# Patient Record
Sex: Female | Born: 1945
Health system: Southern US, Community
[De-identification: ages and names within clinical notes are randomized; demographics above are authoritative.]

## PROBLEM LIST (undated history)

## (undated) DIAGNOSIS — E039 Hypothyroidism, unspecified: Secondary | ICD-10-CM

## (undated) DIAGNOSIS — D649 Anemia, unspecified: Secondary | ICD-10-CM

## (undated) DIAGNOSIS — R112 Nausea with vomiting, unspecified: Secondary | ICD-10-CM

## (undated) DIAGNOSIS — R590 Localized enlarged lymph nodes: Secondary | ICD-10-CM

## (undated) DIAGNOSIS — T4145XA Adverse effect of unspecified anesthetic, initial encounter: Secondary | ICD-10-CM

## (undated) DIAGNOSIS — G629 Polyneuropathy, unspecified: Secondary | ICD-10-CM

## (undated) DIAGNOSIS — G893 Neoplasm related pain (acute) (chronic): Secondary | ICD-10-CM

## (undated) DIAGNOSIS — T8859XA Other complications of anesthesia, initial encounter: Secondary | ICD-10-CM

## (undated) DIAGNOSIS — C50919 Malignant neoplasm of unspecified site of unspecified female breast: Secondary | ICD-10-CM

## (undated) DIAGNOSIS — E079 Disorder of thyroid, unspecified: Secondary | ICD-10-CM

## (undated) DIAGNOSIS — C801 Malignant (primary) neoplasm, unspecified: Secondary | ICD-10-CM

## (undated) DIAGNOSIS — I10 Essential (primary) hypertension: Secondary | ICD-10-CM

## (undated) DIAGNOSIS — K5790 Diverticulosis of intestine, part unspecified, without perforation or abscess without bleeding: Secondary | ICD-10-CM

## (undated) DIAGNOSIS — C439 Malignant melanoma of skin, unspecified: Secondary | ICD-10-CM

## (undated) DIAGNOSIS — E236 Other disorders of pituitary gland: Secondary | ICD-10-CM

## (undated) DIAGNOSIS — K219 Gastro-esophageal reflux disease without esophagitis: Secondary | ICD-10-CM

## (undated) DIAGNOSIS — G43909 Migraine, unspecified, not intractable, without status migrainosus: Secondary | ICD-10-CM

## (undated) DIAGNOSIS — Z9889 Other specified postprocedural states: Secondary | ICD-10-CM

## (undated) HISTORY — DX: Migraine, unspecified, not intractable, without status migrainosus: G43.909

## (undated) HISTORY — DX: Polyneuropathy, unspecified: G62.9

## (undated) HISTORY — DX: Diverticulosis of intestine, part unspecified, without perforation or abscess without bleeding: K57.90

## (undated) HISTORY — PX: HERNIA REPAIR: SHX51

## (undated) HISTORY — PX: OTHER SURGICAL HISTORY: SHX169

## (undated) HISTORY — DX: Malignant (primary) neoplasm, unspecified: C80.1

## (undated) HISTORY — DX: Disorder of thyroid, unspecified: E07.9

---
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
## 1990-06-09 DIAGNOSIS — C801 Malignant (primary) neoplasm, unspecified: Secondary | ICD-10-CM

## 1990-06-09 DIAGNOSIS — C50919 Malignant neoplasm of unspecified site of unspecified female breast: Secondary | ICD-10-CM

## 1990-06-09 HISTORY — PX: BREAST SURGERY: SHX581

## 1990-06-09 HISTORY — DX: Malignant neoplasm of unspecified site of unspecified female breast: C50.919

## 1990-06-09 HISTORY — DX: Malignant (primary) neoplasm, unspecified: C80.1

## 1991-06-10 HISTORY — PX: BREAST LUMPECTOMY: SHX2

## 2000-11-18 ENCOUNTER — Other Ambulatory Visit: Admission: RE | Admit: 2000-11-18 | Discharge: 2000-11-18 | Payer: Self-pay | Admitting: Family Medicine

## 2001-07-30 ENCOUNTER — Other Ambulatory Visit: Admission: RE | Admit: 2001-07-30 | Discharge: 2001-07-30 | Payer: Self-pay | Admitting: Family Medicine

## 2004-06-09 DIAGNOSIS — K5790 Diverticulosis of intestine, part unspecified, without perforation or abscess without bleeding: Secondary | ICD-10-CM

## 2004-06-09 HISTORY — DX: Diverticulosis of intestine, part unspecified, without perforation or abscess without bleeding: K57.90

## 2004-06-09 HISTORY — PX: COLONOSCOPY: SHX174

## 2004-08-27 ENCOUNTER — Ambulatory Visit: Payer: Self-pay | Admitting: Family Medicine

## 2004-09-13 ENCOUNTER — Ambulatory Visit: Payer: Self-pay | Admitting: General Surgery

## 2004-09-13 LAB — HM COLONOSCOPY

## 2005-11-10 ENCOUNTER — Ambulatory Visit: Payer: Self-pay | Admitting: Family Medicine

## 2006-12-21 ENCOUNTER — Ambulatory Visit: Payer: Self-pay | Admitting: Family Medicine

## 2006-12-24 ENCOUNTER — Ambulatory Visit: Payer: Self-pay | Admitting: Family Medicine

## 2007-06-23 ENCOUNTER — Ambulatory Visit: Payer: Self-pay | Admitting: General Surgery

## 2008-07-06 ENCOUNTER — Ambulatory Visit: Payer: Self-pay | Admitting: Family Medicine

## 2009-06-21 ENCOUNTER — Ambulatory Visit: Payer: Self-pay | Admitting: Family Medicine

## 2009-07-10 ENCOUNTER — Ambulatory Visit: Payer: Self-pay | Admitting: Family Medicine

## 2010-07-11 ENCOUNTER — Ambulatory Visit: Payer: Self-pay | Admitting: Family Medicine

## 2011-07-23 ENCOUNTER — Ambulatory Visit: Payer: Self-pay | Admitting: Family Medicine

## 2011-09-03 LAB — HM PAP SMEAR: HM Pap smear: NEGATIVE

## 2011-09-03 LAB — HM DEXA SCAN

## 2012-07-29 ENCOUNTER — Ambulatory Visit: Payer: Self-pay | Admitting: Family Medicine

## 2012-12-30 DIAGNOSIS — K611 Rectal abscess: Secondary | ICD-10-CM | POA: Insufficient documentation

## 2013-01-05 ENCOUNTER — Encounter: Payer: Self-pay | Admitting: *Deleted

## 2013-01-05 ENCOUNTER — Ambulatory Visit (INDEPENDENT_AMBULATORY_CARE_PROVIDER_SITE_OTHER): Payer: Medicare Other | Admitting: General Surgery

## 2013-01-05 ENCOUNTER — Encounter: Payer: Self-pay | Admitting: General Surgery

## 2013-01-05 VITALS — BP 130/72 | HR 78 | Temp 98.9°F | Resp 12 | Ht 62.0 in | Wt 159.0 lb

## 2013-01-05 DIAGNOSIS — K612 Anorectal abscess: Secondary | ICD-10-CM

## 2013-01-05 DIAGNOSIS — K611 Rectal abscess: Secondary | ICD-10-CM

## 2013-01-05 MED ORDER — DOXYCYCLINE HYCLATE 100 MG PO TABS: 100.0000 mg | ORAL_TABLET | Freq: Two times a day (BID) | ORAL | Status: DC

## 2013-01-05 NOTE — Progress Notes (Addendum)
Patient ID: Katie Keith, female   DO: 1945-12-10, 67 y.o.   MRM: [id number]  Chief Complaint  Patient presents with  . Rectal Problems    possible abscess    HPI Katie Keith is a 67 y.o. female here today for a rectal abscess. Patient noticed it on Thursday and went to Dr. Sullivan Lone on 01/04/13.  States "I just feel rotten". The patient reports no change in bowel habits prior to the noticing a small thickening anterior and to the right of the anus followed by the markedly increased local pain and swelling. No history of anal trauma, diarrhea or constipation.  HPI  Past Medical History  Diagnosis Date  . Cancer 1992    breast  . Thyroid disease   . Migraines   . Diverticulosis 2006    Past Surgical History  Procedure Laterality Date  . Hernia repair    . Colonoscopy  2006    Dr Lemar Livings  . Breast surgery Left 1992    wide excision for DCIS    Family History  Problem Relation Age of Onset  . Breast cancer Mother   . Lung cancer Father     Social History History  Substance Use Topics  . Smoking status: Never Smoker   . Smokeless tobacco: Never Used  . Alcohol Use: No    Allergies  Allergen Reactions  . Penicillins Swelling    Current Outpatient Prescriptions  Medication Sig Dispense Refill  . carvedilol (COREG) 6.25 MG tablet Take 6.25 mg by mouth 2 (two) times daily with a meal.       . doxycycline (VIBRA-TABS) 100 MG tablet Take 1 tablet (100 mg total) by mouth 2 (two) times daily.  20 tablet  0  . hydroxychloroquine (PLAQUENIL) 200 MG tablet Take 200 mg by mouth 2 (two) times daily.       Marland Kitchen levothyroxine (SYNTHROID, LEVOTHROID) 112 MCG tablet Take 112 mcg by mouth daily before breakfast.       . venlafaxine (EFFEXOR) 75 MG tablet Take 75 mg by mouth 2 (two) times daily.        No current facility-administered medications for this visit.    Review of Systems Review of Systems  Constitutional: Positive for chills.  Respiratory: Negative.   Cardiovascular:  Negative.     Blood pressure 130/72, pulse 78, temperature 98.9 F (37.2 C), temperature source Oral, resp. rate 12, height 5\' 2"  (1.575 m), weight 159 lb (72.122 kg).  Physical Exam Physical Exam  Constitutional: She is oriented to person, place, and time. She appears well-developed and well-nourished.  Cardiovascular: Normal rate and regular rhythm.   Pulmonary/Chest: Effort normal and breath sounds normal.  Genitourinary:     Neurological: She is alert and oriented to person, place, and time.  Skin: Skin is warm and dry.   Marked erythema on the right anterior lateral rectum extending to labia   Data Reviewed PCP notes dated January 04, 2013.    Assessment    Perirectal abscess.     Plan    Indications for incision and drainage were reviewed. The area was cleansed with Betadine, and 10 cc of 0.5% Xylocaine with 0.25% Marcaine with 1:200,000 units epinephrine was utilized for local anesthesia. The area was reprepped with Betadine and draped. A 1/2 cm radial incision was made approximately 5 cm in the anus. Less than 5 cc of pale brown/green purulent material was obtained. A use of a hemostat loculations anteriorly towards the posterior aspect of the labia were freed. Minimal  tracking towards the anus. A dry dressing was applied. Culture was obtained. The procedure was well tolerated.  The patient will be placed on doxycycline 100 mg p.o. B.i.d. As she is allergic to penicillin. Katie Keith exposure was discouraged.  A prescription for Norco 5/325, #30 with the inscription 1-2 p.o. Q.4 h. P.r.n. For pain was provided, no refills.       Katie Keith 01/05/2013, 9:17 PM

## 2013-01-05 NOTE — Patient Instructions (Addendum)
Keep area clean Change gauze pad as needed Warm compresses Antibiotic and Norco prescription

## 2013-01-07 LAB — AEROBIC CULTURE

## 2013-01-12 ENCOUNTER — Other Ambulatory Visit: Payer: Self-pay

## 2013-01-17 ENCOUNTER — Encounter: Payer: Self-pay | Admitting: General Surgery

## 2013-01-17 ENCOUNTER — Ambulatory Visit (INDEPENDENT_AMBULATORY_CARE_PROVIDER_SITE_OTHER): Payer: Medicare Other | Admitting: General Surgery

## 2013-01-17 VITALS — BP 122/74 | HR 68 | Resp 14 | Ht 62.0 in | Wt 157.0 lb

## 2013-01-17 DIAGNOSIS — K611 Rectal abscess: Secondary | ICD-10-CM

## 2013-01-17 DIAGNOSIS — K612 Anorectal abscess: Secondary | ICD-10-CM

## 2013-01-17 NOTE — Progress Notes (Signed)
Patient ID: Katie Keith, female   DOB: 23-Nov-1945, 67 y.o.   MRN: [id number]  Chief Complaint  Patient presents with  . Follow-up    perrectal abscess    HPI Katie Keith is a 67 y.o. female. here today following up from an perirectal abscess drained done on 01/05/13.. Patient states she stop taking her antibiotic . She stated breaking out in an rash. She states her abscess better. HPI  Past Medical History  Diagnosis Date  . Cancer 1992    breast  . Thyroid disease   . Migraines   . Diverticulosis 2006    Past Surgical History  Procedure Laterality Date  . Hernia repair    . Colonoscopy  2006    Dr Lemar Livings  . Breast surgery Left 1992    wide excision for DCIS    Family History  Problem Relation Age of Onset  . Breast cancer Mother   . Lung cancer Father     Social History History  Substance Use Topics  . Smoking status: Never Smoker   . Smokeless tobacco: Never Used  . Alcohol Use: No    Allergies  Allergen Reactions  . Penicillins Swelling  . Sulfa Antibiotics Rash    Current Outpatient Prescriptions  Medication Sig Dispense Refill  . carvedilol (COREG) 6.25 MG tablet Take 6.25 mg by mouth 2 (two) times daily with a meal.       . doxycycline (VIBRA-TABS) 100 MG tablet Take 1 tablet (100 mg total) by mouth 2 (two) times daily.  20 tablet  0  . hydroxychloroquine (PLAQUENIL) 200 MG tablet Take 200 mg by mouth 2 (two) times daily.       Marland Kitchen levothyroxine (SYNTHROID, LEVOTHROID) 112 MCG tablet Take 112 mcg by mouth daily before breakfast.       . venlafaxine (EFFEXOR) 75 MG tablet Take 75 mg by mouth 2 (two) times daily.        No current facility-administered medications for this visit.    Review of Systems Review of Systems  Constitutional: Negative.   Respiratory: Negative.   Cardiovascular: Negative.     Blood pressure 122/74, pulse 68, resp. rate 14, height 5\' 2"  (1.575 m), weight 157 lb (71.215 kg).  Physical Exam Physical Exam  Genitourinary:      The right gluteal area shows no residual erythema, induration or tenderness.    Data Reviewed Culture showed evidence of non-MRSA.   Assessment    Likely perirectal abscess, resolved status post incision and drainage.     Plan    The patient will notify the office if she has any recurrent problems. She was advised that approximately 50% of patients will have no further difficulty, the other will develop a fistula in ano.        Earline Mayotte 01/17/2013, 9:20 PM

## 2013-01-17 NOTE — Patient Instructions (Signed)
Patient to return as needed. 

## 2013-04-14 ENCOUNTER — Other Ambulatory Visit: Payer: Self-pay

## 2013-08-01 ENCOUNTER — Ambulatory Visit: Payer: Self-pay | Admitting: Family Medicine

## 2013-08-01 LAB — HM MAMMOGRAPHY

## 2013-10-28 ENCOUNTER — Ambulatory Visit: Payer: Self-pay | Admitting: Otolaryngology

## 2014-03-24 ENCOUNTER — Other Ambulatory Visit: Payer: Self-pay

## 2014-04-10 ENCOUNTER — Encounter: Payer: Self-pay | Admitting: General Surgery

## 2014-08-02 ENCOUNTER — Ambulatory Visit: Payer: Self-pay | Admitting: Family Medicine

## 2014-08-24 ENCOUNTER — Encounter: Payer: Self-pay | Admitting: General Surgery

## 2014-10-03 ENCOUNTER — Ambulatory Visit
Admit: 2014-10-03 | Disposition: A | Discharge status: Home / Self Care | Payer: Self-pay | Attending: Family Medicine | Admitting: Family Medicine

## 2014-10-03 LAB — BASIC METABOLIC PANEL
BUN: 21 mg/dL (ref 4–21)
Creatinine: 1 mg/dL (ref <=1.1)
Glucose: 89 mg/dL
POTASSIUM: 5.4 mmol/L — AB (ref 3.4–5.3)
Sodium: 139 mmol/L (ref 137–147)

## 2014-10-03 LAB — LIPID PANEL
Cholesterol: 166 mg/dL (ref 0–200)
HDL: 27 mg/dL — AB (ref 35–70)
LDL Cholesterol: 88 mg/dL
Triglycerides: 254 mg/dL — AB (ref 40–160)

## 2014-10-03 LAB — HEPATIC FUNCTION PANEL
ALT: 13 U/L (ref 7–35)
AST: 20 U/L (ref 13–35)

## 2014-10-03 LAB — TSH: TSH: 1.25 u[IU]/mL (ref <=5.90)

## 2014-10-03 LAB — CBC AND DIFFERENTIAL
HCT: 41 % (ref 36–46)
Hemoglobin: 13.2 g/dL (ref 12.0–16.0)
PLATELETS: 298 10*3/uL (ref 150–399)
WBC: 7 10^3/mL

## 2014-10-04 ENCOUNTER — Ambulatory Visit: Payer: PPO | Admitting: General Surgery

## 2014-10-04 ENCOUNTER — Encounter: Payer: Self-pay | Admitting: General Surgery

## 2014-10-04 ENCOUNTER — Ambulatory Visit (INDEPENDENT_AMBULATORY_CARE_PROVIDER_SITE_OTHER): Payer: PPO | Admitting: General Surgery

## 2014-10-04 VITALS — BP 132/82 | HR 76 | Resp 14 | Ht 63.0 in | Wt 161.0 lb

## 2014-10-04 DIAGNOSIS — R1084 Generalized abdominal pain: Secondary | ICD-10-CM | POA: Insufficient documentation

## 2014-10-04 DIAGNOSIS — M546 Pain in thoracic spine: Secondary | ICD-10-CM | POA: Diagnosis not present

## 2014-10-04 NOTE — Progress Notes (Signed)
Patient ID: Katie Keith, female   DOB: 02/12/1946, 69 y.o.   MRN: [id number]  Chief Complaint  Patient presents with  . Colonoscopy    HPI Katie Keith is a 69 y.o. female.  Here today originally to discuss having a colonoscopy. She has had some left flank pain for about 2-3 weeks. She has been followed by Dr Rosanna Randy and had labs and chest xray yesterday. She is scheduled for ultrasound on abdominal Friday.Denies any diarrhea or constipation. She does have intermittent bloating. Bowels are normal and every day or every other day. She does have a history of ruptured disc and went to a chiropractor in the 1990's.  The patient reports she is most symptomatic when she is laying supine and trying to get out of bed. She is unable to sit up directly, rather needing to roll to her side, slider feet to the floor and then push her body upwards. She reports being comfortable while standing, walking or doing activities of daily living. She has not experienced any incontinence of bowel or bladder. She has a history of neuropathy of unknown etiology, but is not appreciated at progression of mild left-sided weakness.  HPI  Past Medical History  Diagnosis Date  . Cancer 1992    breast  . Thyroid disease   . Migraines   . Diverticulosis 2006  . Peripheral neuropathy     Past Surgical History  Procedure Laterality Date  . Hernia repair    . Colonoscopy  2006    Dr Bary Castilla  . Breast surgery Left 1992    wide excision for DCIS  . Thumb surgery      injury with infection    Family History  Problem Relation Age of Onset  . Breast cancer Mother   . Lung cancer Father   . Diabetes Mother   . Cancer Sister 92    breast    Social History History  Substance Use Topics  . Smoking status: Never Smoker   . Smokeless tobacco: Never Used  . Alcohol Use: No    Allergies  Allergen Reactions  . Penicillins Swelling  . Sulfa Antibiotics Rash    Current Outpatient Prescriptions   Medication Sig Dispense Refill  . ALPHA LIPOIC ACID PO Take by mouth 2 (two) times daily.    Marland Kitchen aspirin 81 MG tablet Take 81 mg by mouth daily.    . Biotin 5000 MCG CAPS Take by mouth daily.    . Calcium Carbonate-Vitamin D (CALCIUM + D PO) Take by mouth daily.    . carvedilol (COREG) 6.25 MG tablet Take 6.25 mg by mouth 2 (two) times daily with a meal.     . CINNAMON PO Take by mouth daily.    Marland Kitchen levothyroxine (SYNTHROID, LEVOTHROID) 112 MCG tablet Take 112 mcg by mouth daily before breakfast.      No current facility-administered medications for this visit.    Review of Systems Review of Systems  Constitutional: Negative.   Respiratory: Negative.   Cardiovascular: Negative.   Gastrointestinal: Positive for abdominal pain. Negative for diarrhea and constipation.    Blood pressure 132/82, pulse 76, resp. rate 14, height 5\' 3"  (1.6 m), weight 161 lb (73.029 kg).  Physical Exam Physical Exam  Constitutional: She is oriented to person, place, and time. She appears well-developed and well-nourished.  Cardiovascular: Normal rate, regular rhythm, normal heart sounds and intact distal pulses.   No lower leg edema.  Pulmonary/Chest: Effort normal and breath sounds normal.  Abdominal: Soft. Normal appearance.  Tender lower abdomen.  Musculoskeletal:  Tender at left 11th and 12th rib area.  Neurological: She is alert and oriented to person, place, and time. She has normal strength.  Skin: Skin is warm and dry.    Data Reviewed Chest x-ray completed yesterday reviewed. No cardiopulmonary abnormality. Age indeterminant superior endplate deformity within the lower thoracic spine.  Anoscopy completed 09/13/2004 showed a lymphoid nodule at 30 cm.  Assessment    Back/flank pain related to musculoskeletal source.    Plan    The patient is scheduled for an abdominal and pelvic ultrasound later this week by her PCP. If this is normal, MRI of the thoracic spine will be appropriate.     The patient exhibited exquisite pain while getting up from the examining table today. In light of this she's been asked to make use of a prednisone taper beginning at 60 mg today and decreasing by 10 daily. She was asked not to make use of other nonsteroidals while taking prednisone. Prednisone 60 mg taper.   PCP:  Cranford Mon, Donnal Moat, Forest Gleason 10/04/2014, 8:29 PM

## 2014-10-04 NOTE — Patient Instructions (Addendum)
Prednisone 60 mg today then taper by 10 mg each day.-no ibuprofen while on prednisone

## 2014-10-05 ENCOUNTER — Other Ambulatory Visit: Payer: Self-pay | Admitting: *Deleted

## 2014-10-05 DIAGNOSIS — M5414 Radiculopathy, thoracic region: Secondary | ICD-10-CM

## 2014-10-05 DIAGNOSIS — M5416 Radiculopathy, lumbar region: Secondary | ICD-10-CM

## 2014-10-05 NOTE — Progress Notes (Signed)
Patient has been scheduled for a MRI thoracic and lumbar for left radicular pain T12, L1 on Saturday, 10-14-14 at 10:45 am (arrive 10:15 [facility] She was offered 10-12-14 but will be out of town.  No prep.   This patient verbalizes understanding and will call the office if further questions.

## 2014-10-06 ENCOUNTER — Ambulatory Visit: Admit: 2014-10-06 | Disposition: A | Discharge status: Home / Self Care | Payer: Self-pay | Admitting: Family Medicine

## 2014-10-14 ENCOUNTER — Ambulatory Visit
Admission: RE | Admit: 2014-10-14 | Discharge: 2014-10-14 | Disposition: A | Discharge status: Home / Self Care | Payer: PPO | Source: Ambulatory Visit | Attending: General Surgery | Admitting: General Surgery

## 2014-10-14 DIAGNOSIS — M4854XA Collapsed vertebra, not elsewhere classified, thoracic region, initial encounter for fracture: Secondary | ICD-10-CM | POA: Insufficient documentation

## 2014-10-14 DIAGNOSIS — M5414 Radiculopathy, thoracic region: Secondary | ICD-10-CM

## 2014-10-14 DIAGNOSIS — X58XXXA Exposure to other specified factors, initial encounter: Secondary | ICD-10-CM | POA: Diagnosis not present

## 2014-10-14 DIAGNOSIS — M5416 Radiculopathy, lumbar region: Secondary | ICD-10-CM | POA: Diagnosis present

## 2014-10-17 ENCOUNTER — Telehealth: Payer: Self-pay | Admitting: *Deleted

## 2014-10-17 NOTE — Telephone Encounter (Signed)
appt with Dr Mack Guise is for May 19 at 930, pt aware.

## 2014-11-22 ENCOUNTER — Ambulatory Visit (INDEPENDENT_AMBULATORY_CARE_PROVIDER_SITE_OTHER): Payer: PPO

## 2014-11-22 DIAGNOSIS — E538 Deficiency of other specified B group vitamins: Secondary | ICD-10-CM | POA: Diagnosis not present

## 2014-11-22 MED ORDER — CYANOCOBALAMIN 1000 MCG/ML IJ SOLN
1000.0000 ug | Freq: Once | INTRAMUSCULAR | Status: AC
  Administered 2014-11-22: 1000 ug via INTRAMUSCULAR

## 2014-11-22 NOTE — Progress Notes (Signed)
Vitamin B12 injection today at office. Tolerated well. Will come back in 3 weeks for Vitamin B12 injection per MD's order.

## 2014-11-27 ENCOUNTER — Other Ambulatory Visit: Payer: Self-pay

## 2014-11-27 ENCOUNTER — Other Ambulatory Visit: Payer: Self-pay | Admitting: Family Medicine

## 2014-11-27 DIAGNOSIS — E039 Hypothyroidism, unspecified: Secondary | ICD-10-CM

## 2014-11-27 MED ORDER — LEVOTHYROXINE SODIUM 112 MCG PO TABS: 112.0000 ug | ORAL_TABLET | Freq: Every day | ORAL | Status: DC

## 2014-12-04 ENCOUNTER — Other Ambulatory Visit: Payer: Self-pay

## 2014-12-13 ENCOUNTER — Ambulatory Visit (INDEPENDENT_AMBULATORY_CARE_PROVIDER_SITE_OTHER): Payer: PPO

## 2014-12-13 DIAGNOSIS — E538 Deficiency of other specified B group vitamins: Secondary | ICD-10-CM | POA: Diagnosis not present

## 2014-12-13 MED ORDER — CYANOCOBALAMIN 1000 MCG/ML IJ SOLN
1000.0000 ug | Freq: Once | INTRAMUSCULAR | Status: AC
  Administered 2014-12-13: 1000 ug via INTRAMUSCULAR

## 2014-12-13 NOTE — Progress Notes (Signed)
Vitamin B12 Injection given today at office. Tolerated well. Will come back in 3 weeks per MD's order.

## 2015-01-03 ENCOUNTER — Ambulatory Visit (INDEPENDENT_AMBULATORY_CARE_PROVIDER_SITE_OTHER): Payer: PPO

## 2015-01-03 DIAGNOSIS — E538 Deficiency of other specified B group vitamins: Secondary | ICD-10-CM

## 2015-01-03 MED ORDER — CYANOCOBALAMIN 1000 MCG/ML IJ SOLN
1000.0000 ug | Freq: Once | INTRAMUSCULAR | Status: AC
  Administered 2015-01-03: 1000 ug via INTRAMUSCULAR

## 2015-01-03 NOTE — Progress Notes (Signed)
Vitamin B12 injection today at office. Tolerated well. Will come back in 3 weeks per MD's order

## 2015-01-24 ENCOUNTER — Ambulatory Visit (INDEPENDENT_AMBULATORY_CARE_PROVIDER_SITE_OTHER): Payer: PPO

## 2015-01-24 DIAGNOSIS — E538 Deficiency of other specified B group vitamins: Secondary | ICD-10-CM

## 2015-01-24 MED ORDER — CYANOCOBALAMIN 1000 MCG/ML IJ SOLN
1000.0000 ug | Freq: Once | INTRAMUSCULAR | Status: AC
  Administered 2015-01-24: 1000 ug via INTRAMUSCULAR

## 2015-01-24 NOTE — Progress Notes (Signed)
Vitamin B 12 injection today at office. Tolerated well Will come back in 3 weeks per MD's orders.

## 2015-02-14 ENCOUNTER — Ambulatory Visit: Payer: PPO

## 2015-02-14 NOTE — Patient Instructions (Signed)
Pt received a B12 injection today.

## 2015-02-16 ENCOUNTER — Ambulatory Visit (INDEPENDENT_AMBULATORY_CARE_PROVIDER_SITE_OTHER): Payer: PPO

## 2015-02-16 DIAGNOSIS — E538 Deficiency of other specified B group vitamins: Secondary | ICD-10-CM | POA: Diagnosis not present

## 2015-02-16 MED ORDER — CYANOCOBALAMIN 1000 MCG/ML IJ SOLN
1000.0000 ug | Freq: Once | INTRAMUSCULAR | Status: AC
  Administered 2015-02-16: 1000 ug via INTRAMUSCULAR

## 2015-02-21 ENCOUNTER — Ambulatory Visit (INDEPENDENT_AMBULATORY_CARE_PROVIDER_SITE_OTHER): Payer: PPO | Admitting: General Surgery

## 2015-02-21 ENCOUNTER — Encounter: Payer: Self-pay | Admitting: General Surgery

## 2015-02-21 VITALS — BP 124/72 | HR 82 | Resp 16 | Ht 62.0 in | Wt 152.2 lb

## 2015-02-21 DIAGNOSIS — Z853 Personal history of malignant neoplasm of breast: Secondary | ICD-10-CM

## 2015-02-21 DIAGNOSIS — Z1211 Encounter for screening for malignant neoplasm of colon: Secondary | ICD-10-CM | POA: Diagnosis not present

## 2015-02-21 MED ORDER — POLYETHYLENE GLYCOL 3350 17 GM/SCOOP PO POWD: ORAL | Status: DC

## 2015-02-21 NOTE — Patient Instructions (Addendum)
Colonoscopy A colonoscopy is an exam to look at the entire large intestine (colon). This exam can help find problems such as tumors, polyps, inflammation, and areas of bleeding. The exam takes about 1 hour.  LET [facility] CARE PROVIDER KNOW ABOUT:   Any allergies you have.  All medicines you are taking, including vitamins, herbs, eye drops, creams, and over-the-counter medicines.  Previous problems you or members of your family have had with the use of anesthetics.  Any blood disorders you have.  Previous surgeries you have had.  Medical conditions you have. RISKS AND COMPLICATIONS  Generally, this is a safe procedure. However, as with any procedure, complications can occur. Possible complications include:  Bleeding.  Tearing or rupture of the colon wall.  Reaction to medicines given during the exam.  Infection (rare). BEFORE THE PROCEDURE   Ask your health care provider about changing or stopping your regular medicines.  You may be prescribed an oral bowel prep. This involves drinking a large amount of medicated liquid, starting the day before your procedure. The liquid will cause you to have multiple loose stools until your stool is almost clear or light green. This cleans out your colon in preparation for the procedure.  Do not eat or drink anything else once you have started the bowel prep, unless your health care provider tells you it is safe to do so.  Arrange for someone to drive you home after the procedure. PROCEDURE   You will be given medicine to help you relax (sedative).  You will lie on your side with your knees bent.  A long, flexible tube with a light and camera on the end (colonoscope) will be inserted through the rectum and into the colon. The camera sends video back to a computer screen as it moves through the colon. The colonoscope also releases carbon dioxide gas to inflate the colon. This helps your health care provider see the area better.  During  the exam, your health care provider may take a small tissue sample (biopsy) to be examined under a microscope if any abnormalities are found.  The exam is finished when the entire colon has been viewed. AFTER THE PROCEDURE   Do not drive for 24 hours after the exam.  You may have a small amount of blood in your stool.  You may pass moderate amounts of gas and have mild abdominal cramping or bloating. This is caused by the gas used to inflate your colon during the exam.  Ask when your test results will be ready and how you will get your results. Make sure you get your test results. Document Released: 05/23/2000 Document Revised: 03/16/2013 Document Reviewed: 01/31/2013 [facility] Patient Information 2015 [place], [place]. This information is not intended to replace advice given to you by your health care provider. Make sure you discuss any questions you have with your health care provider.   Patient has been scheduled for a colonoscopy on 04-18-15 at [facility].

## 2015-02-21 NOTE — Progress Notes (Signed)
Patient ID: Katie Keith, female   DOB: 11-01-1945, 69 y.o.   MRN: [id number]  Chief Complaint  Patient presents with  . Colonoscopy    HPI Katie Keith is a 69 y.o. female here today to discuss having a Coloscopy. Last done 09/13/2004. She has no complaints today, regular bowel movements, no bleeding. She does state that she gets "bloated" feeling with large meal and feels "full" until she has a BM. Denies heartburn, indigestion or reflux. No change in bowel habits. No history of blood or mucus in the stools.  2006 colonoscopy was normal.  She does have a small spot on her lower back that she is concerned about.  HPI  Past Medical History  Diagnosis Date  . Thyroid disease   . Migraines   . Diverticulosis 2006  . Peripheral neuropathy   . Cancer 1992    DCIS left breast, treated with wide excision.    Past Surgical History  Procedure Laterality Date  . Hernia repair    . Colonoscopy  2006    Dr Bary Castilla  . Thumb surgery      injury with infection  . Breast surgery Left 1992    wide excision for DCIS    Family History  Problem Relation Age of Onset  . Breast cancer Mother   . Diabetes Mother   . Lung cancer Father   . Cancer Sister 68    breast    Social History Social History  Substance Use Topics  . Smoking status: Never Smoker   . Smokeless tobacco: Never Used  . Alcohol Use: No    Allergies  Allergen Reactions  . Penicillins Swelling  . Sulfa Antibiotics Rash    Current Outpatient Prescriptions  Medication Sig Dispense Refill  . aspirin 81 MG tablet Take 81 mg by mouth daily.    . Biotin 5000 MCG CAPS Take by mouth daily.    . Calcium Carbonate-Vitamin D (CALCIUM + D PO) Take by mouth daily.    . carvedilol (COREG) 6.25 MG tablet Take 6.25 mg by mouth 2 (two) times daily with a meal.     . CINNAMON PO Take by mouth daily.    Marland Kitchen levothyroxine (SYNTHROID, LEVOTHROID) 112 MCG tablet Take 1 tablet (112 mcg total) by mouth daily before breakfast. 30  tablet 12  . ALPHA LIPOIC ACID PO Take by mouth 2 (two) times daily.    . polyethylene glycol powder (GLYCOLAX/MIRALAX) powder 255 grams one bottle for colonoscopy prep 255 g 0   No current facility-administered medications for this visit.    Review of Systems Review of Systems  Constitutional: Negative.   Respiratory: Negative.   Cardiovascular: Negative.     Blood pressure 124/72, pulse 82, resp. rate 16, height 5\' 2"  (1.575 m), weight 152 lb 3.2 oz (69.037 kg). Weight in spring 2004:139 pounds.  Physical Exam Physical Exam  Constitutional: She is oriented to person, place, and time. She appears well-developed and well-nourished.  HENT:  Mouth/Throat: Oropharynx is clear and moist.  Eyes: Conjunctivae are normal. No scleral icterus.  Neck: Neck supple.  Cardiovascular: Normal rate, regular rhythm and normal heart sounds.   Pulmonary/Chest: Effort normal and breath sounds normal.  Abdominal: Soft. Normal appearance. There is tenderness in the left upper quadrant.  Neurological: She is alert and oriented to person, place, and time.  Skin: Skin is warm and dry.  Seborrheic keratosis  6 x 89 mm left of mid line lower back  Psychiatric: Her behavior is normal.  Data Reviewed 2006 colonoscopy showed a single polyp in the sigmoid colon. Pathology showed normal colonic mucosa and a lymphoid follicle.   Assessment    Candidate for screening colonoscopy.    Plan        Colonoscopy with possible biopsy/polypectomy prn: Information regarding the procedure, including its potential risks and complications (including but not limited to perforation of the bowel, which may require emergency surgery to repair, and bleeding) was verbally given to the patient. Educational information regarding lower intestinal endoscopy was given to the patient. Written instructions for how to complete the bowel prep using Miralax were provided. The importance of drinking ample fluids to avoid  dehydration as a result of the prep emphasized.  Patient has been scheduled for a colonoscopy on 04-18-15 at [facility]   PCP: Dr. Juliette Mangle, [place] Gleason 02/22/2015, 7:29 AM

## 2015-02-22 ENCOUNTER — Encounter: Payer: Self-pay | Admitting: General Surgery

## 2015-02-22 DIAGNOSIS — Z1211 Encounter for screening for malignant neoplasm of colon: Secondary | ICD-10-CM | POA: Insufficient documentation

## 2015-02-22 DIAGNOSIS — Z853 Personal history of malignant neoplasm of breast: Secondary | ICD-10-CM | POA: Insufficient documentation

## 2015-03-07 ENCOUNTER — Ambulatory Visit (INDEPENDENT_AMBULATORY_CARE_PROVIDER_SITE_OTHER): Payer: PPO

## 2015-03-07 DIAGNOSIS — E538 Deficiency of other specified B group vitamins: Secondary | ICD-10-CM | POA: Diagnosis not present

## 2015-03-07 MED ORDER — CYANOCOBALAMIN 1000 MCG/ML IJ SOLN
1000.0000 ug | Freq: Once | INTRAMUSCULAR | Status: AC
  Administered 2015-03-07: 1000 ug via INTRAMUSCULAR

## 2015-03-16 ENCOUNTER — Encounter: Payer: Self-pay | Admitting: Family Medicine

## 2015-03-16 ENCOUNTER — Ambulatory Visit (INDEPENDENT_AMBULATORY_CARE_PROVIDER_SITE_OTHER): Payer: PPO | Admitting: Family Medicine

## 2015-03-16 VITALS — BP 132/82 | HR 94 | Temp 97.7°F | Resp 18 | Wt 154.6 lb

## 2015-03-16 DIAGNOSIS — H6981 Other specified disorders of Eustachian tube, right ear: Secondary | ICD-10-CM | POA: Diagnosis not present

## 2015-03-16 DIAGNOSIS — H6980 Other specified disorders of Eustachian tube, unspecified ear: Secondary | ICD-10-CM | POA: Insufficient documentation

## 2015-03-16 NOTE — Progress Notes (Signed)
Patient ID: SHARVI MOONEYHAN, female   DOB: [birth year], 69 y.o.   MRN: [id number] Name: Katie Keith   MRN: [id number]    DOB: [birth year]   Date:03/16/2015       Progress Note  Subjective  Chief Complaint  Chief Complaint  Patient presents with  . Ear Pain    Right X 3 days   Otalgia  There is pain in the right ear. This is a new problem. The current episode started in the past 7 days. The problem occurs constantly. The problem has been gradually worsening. There has been no fever. Pertinent negatives include no ear discharge, headaches, hearing loss, rhinorrhea or sore throat. Associated symptoms comments: Stopped up sensation.. She has tried acetaminophen for the symptoms. The treatment provided no relief.   Past Surgical History  Procedure Laterality Date  . Hernia repair    . Colonoscopy  2006    Dr Bary Castilla  . Thumb surgery      injury with infection  . Breast surgery Left 1992    wide excision for DCIS   Family History  Problem Relation Age of Onset  . Breast cancer Mother   . Diabetes Mother   . Lung cancer Father   . Cancer Sister 44    breast   Past Medical History  Diagnosis Date  . Thyroid disease   . Migraines   . Diverticulosis 2006  . Peripheral neuropathy ([place])   . Cancer ([place]) 1992    DCIS left breast, treated with wide excision.   Social History  Substance Use Topics  . Smoking status: Never Smoker   . Smokeless tobacco: Never Used  . Alcohol Use: No    Current outpatient prescriptions:  .  aspirin 81 MG tablet, Take 81 mg by mouth daily., Disp: , Rfl:  .  Biotin 5000 MCG CAPS, Take by mouth daily., Disp: , Rfl:  .  Calcium Carbonate-Vitamin D (CALCIUM + D PO), Take by mouth daily., Disp: , Rfl:  .  carvedilol (COREG) 6.25 MG tablet, Take 6.25 mg by mouth 2 (two) times daily with a meal. , Disp: , Rfl:  .  CINNAMON PO, Take by mouth daily., Disp: , Rfl:  .  levothyroxine (SYNTHROID, LEVOTHROID) 112 MCG tablet, Take 1 tablet (112 mcg total) by  mouth daily before breakfast., Disp: 30 tablet, Rfl: 12 .  polyethylene glycol powder (GLYCOLAX/MIRALAX) powder, 255 grams one bottle for colonoscopy prep, Disp: 255 g, Rfl: 0  Allergies  Allergen Reactions  . Penicillins Swelling  . Sulfa Antibiotics Rash   Review of Systems  Constitutional: Negative.   HENT: Positive for ear pain. Negative for ear discharge, hearing loss, rhinorrhea and sore throat.   Eyes: Negative.   Respiratory: Negative.   Cardiovascular: Negative.   Gastrointestinal: Negative.   Genitourinary: Negative.   Musculoskeletal: Negative.   Skin: Negative.   Neurological: Negative.  Negative for headaches.  Endo/Heme/Allergies: Negative.   Psychiatric/Behavioral: Negative.    Objective  Filed Vitals:   03/16/15 0929  BP: 132/82  Pulse: 94  Temp: 97.7 F (36.5 C)  TempSrc: Oral  Resp: 18  Weight: 154 lb 9.6 oz (70.126 kg)  SpO2: 98%   Physical Exam  Constitutional: She is oriented to person, place, and time and well-developed, well-nourished, and in no distress.  HENT:  Head: Normocephalic.  Right Ear: External ear normal.  Left Ear: External ear normal.  Mouth/Throat: Oropharynx is clear and moist.  Eyes: Conjunctivae are normal.  Neck: Normal range of motion.  Neck supple.  Cardiovascular: Normal rate and regular rhythm.   Musculoskeletal: Normal range of motion.  Neurological: She is alert and oriented to person, place, and time.  Skin: No rash noted.   Assessment & Plan  1. Eustachian tube dysfunction, right Recent onset with stopped up sensation without drainage from the ear or URI symptoms today (had some last week). May use Flonase at bedtime and add Claritin prn. Recheck prn.

## 2015-03-28 ENCOUNTER — Ambulatory Visit (INDEPENDENT_AMBULATORY_CARE_PROVIDER_SITE_OTHER): Payer: PPO

## 2015-03-28 DIAGNOSIS — E538 Deficiency of other specified B group vitamins: Secondary | ICD-10-CM

## 2015-03-28 MED ORDER — CYANOCOBALAMIN 1000 MCG/ML IJ SOLN
1000.0000 ug | Freq: Once | INTRAMUSCULAR | Status: AC
  Administered 2015-03-28: 1000 ug via INTRAMUSCULAR

## 2015-03-28 NOTE — Progress Notes (Signed)
Patient ID: Katie Keith, female   DOB: 14-Feb-1946, 69 y.o.   MRN: [id number] Vitamin B 12 injection given today in office. Patient tolerated injection well. Return in 3 weeks.

## 2015-04-11 ENCOUNTER — Telehealth: Payer: Self-pay | Admitting: *Deleted

## 2015-04-11 NOTE — Telephone Encounter (Signed)
Patient contacted today and confirms no medication changes since last office visit.   Also, reports that she has Miralax prescription.   We will proceed with colonoscopy that is scheduled at [facility] for 04-18-15.   Patient instructed to call the office should she have further questions.

## 2015-04-13 ENCOUNTER — Ambulatory Visit (INDEPENDENT_AMBULATORY_CARE_PROVIDER_SITE_OTHER): Payer: PPO

## 2015-04-13 DIAGNOSIS — E538 Deficiency of other specified B group vitamins: Secondary | ICD-10-CM

## 2015-04-13 MED ORDER — CYANOCOBALAMIN 1000 MCG/ML IJ SOLN
1000.0000 ug | Freq: Once | INTRAMUSCULAR | Status: AC
  Administered 2015-04-13: 1000 ug via INTRAMUSCULAR

## 2015-04-13 NOTE — Progress Notes (Signed)
Vit. B12 injection was given today. Patient is to return in 3 weeks for next injection.

## 2015-04-16 ENCOUNTER — Other Ambulatory Visit: Payer: Self-pay | Admitting: General Surgery

## 2015-04-16 DIAGNOSIS — Z1211 Encounter for screening for malignant neoplasm of colon: Secondary | ICD-10-CM

## 2015-04-17 ENCOUNTER — Encounter: Payer: Self-pay | Admitting: *Deleted

## 2015-04-18 ENCOUNTER — Encounter: Payer: Self-pay | Admitting: *Deleted

## 2015-04-18 ENCOUNTER — Encounter
Admission: RE | Disposition: A | Discharge status: Home / Self Care | Payer: Self-pay | Source: Ambulatory Visit | Attending: General Surgery

## 2015-04-18 ENCOUNTER — Ambulatory Visit
Admission: RE | Admit: 2015-04-18 | Discharge: 2015-04-18 | Disposition: A | Discharge status: Home / Self Care | Payer: PPO | Source: Ambulatory Visit | Attending: General Surgery | Admitting: General Surgery

## 2015-04-18 ENCOUNTER — Ambulatory Visit: Payer: PPO | Admitting: Anesthesiology

## 2015-04-18 DIAGNOSIS — E669 Obesity, unspecified: Secondary | ICD-10-CM | POA: Diagnosis not present

## 2015-04-18 DIAGNOSIS — Z79899 Other long term (current) drug therapy: Secondary | ICD-10-CM | POA: Diagnosis not present

## 2015-04-18 DIAGNOSIS — E039 Hypothyroidism, unspecified: Secondary | ICD-10-CM | POA: Diagnosis not present

## 2015-04-18 DIAGNOSIS — D123 Benign neoplasm of transverse colon: Secondary | ICD-10-CM | POA: Insufficient documentation

## 2015-04-18 DIAGNOSIS — Z880 Allergy status to penicillin: Secondary | ICD-10-CM | POA: Diagnosis not present

## 2015-04-18 DIAGNOSIS — K5730 Diverticulosis of large intestine without perforation or abscess without bleeding: Secondary | ICD-10-CM | POA: Diagnosis not present

## 2015-04-18 DIAGNOSIS — Z6827 Body mass index (BMI) 27.0-27.9, adult: Secondary | ICD-10-CM | POA: Diagnosis not present

## 2015-04-18 DIAGNOSIS — Z1211 Encounter for screening for malignant neoplasm of colon: Secondary | ICD-10-CM | POA: Insufficient documentation

## 2015-04-18 DIAGNOSIS — Z7982 Long term (current) use of aspirin: Secondary | ICD-10-CM | POA: Diagnosis not present

## 2015-04-18 HISTORY — PX: COLONOSCOPY WITH PROPOFOL: SHX5780

## 2015-04-18 HISTORY — DX: Hypothyroidism, unspecified: E03.9

## 2015-04-18 SURGERY — COLONOSCOPY WITH PROPOFOL
Anesthesia: General

## 2015-04-18 MED ORDER — SODIUM CHLORIDE 0.9 % IV SOLN
INTRAVENOUS | Status: DC
  Administered 2015-04-18: 09:00:00 via INTRAVENOUS
  Administered 2015-04-18: 1000 mL via INTRAVENOUS

## 2015-04-18 MED ORDER — MIDAZOLAM HCL 2 MG/2ML IJ SOLN
INTRAMUSCULAR | Status: DC | PRN
  Administered 2015-04-18: 2 mg via INTRAVENOUS

## 2015-04-18 MED ORDER — PROPOFOL 500 MG/50ML IV EMUL
INTRAVENOUS | Status: DC | PRN
  Administered 2015-04-18: 150 ug/kg/min via INTRAVENOUS

## 2015-04-18 MED ORDER — LIDOCAINE HCL (CARDIAC) 20 MG/ML IV SOLN
INTRAVENOUS | Status: DC | PRN
  Administered 2015-04-18: 80 mg via INTRAVENOUS

## 2015-04-18 NOTE — H&P (Signed)
Katie Keith [id number] [birth year]     HPI:  For screening colonoscopy.  Prescriptions prior to admission  Medication Sig Dispense Refill Last Dose  . carvedilol (COREG) 6.25 MG tablet Take 6.25 mg by mouth 2 (two) times daily with a meal.    04/18/2015 at 0600  . levothyroxine (SYNTHROID, LEVOTHROID) 112 MCG tablet Take 1 tablet (112 mcg total) by mouth daily before breakfast. 30 tablet 12 04/18/2015 at 0600  . aspirin 81 MG tablet Take 81 mg by mouth daily.   Taking  . Biotin 5000 MCG CAPS Take by mouth daily.   Taking  . Calcium Carbonate-Vitamin D (CALCIUM + D PO) Take by mouth daily.   Taking  . CINNAMON PO Take by mouth daily.   Taking  . polyethylene glycol powder (GLYCOLAX/MIRALAX) powder 255 grams one bottle for colonoscopy prep 255 g 0 Taking   Allergies  Allergen Reactions  . Penicillins Swelling  . Sulfa Antibiotics Rash   Past Medical History  Diagnosis Date  . Thyroid disease   . Migraines   . Diverticulosis 2006  . Peripheral neuropathy ([place])   . Cancer ([place]) 1992    DCIS left breast, treated with wide excision.  . Hypothyroidism    Past Surgical History  Procedure Laterality Date  . Hernia repair    . Colonoscopy  2006    Dr Bary Castilla  . Thumb surgery      injury with infection  . Breast surgery Left 1992    wide excision for DCIS   Social History   Social History  . Marital Status: Married    Spouse Name: N/A  . Number of Children: N/A  . Years of Education: N/A   Occupational History  . Not on file.   Social History Main Topics  . Smoking status: Never Smoker   . Smokeless tobacco: Never Used  . Alcohol Use: No  . Drug Use: No  . Sexual Activity: Not on file   Other Topics Concern  . Not on file   Social History Narrative   Social History   Social History Narrative     ROS: Negative.     PE: HEENT: Negative. Lungs: Clear. Cardio: RR. Robert Bellow 04/18/2015   Assessment/Plan:  Proceed with planned endoscopy.

## 2015-04-18 NOTE — Anesthesia Preprocedure Evaluation (Signed)
Anesthesia Evaluation  Patient identified by MRN, date of birth, ID band Patient awake    Reviewed: Allergy & Precautions, NPO status , Patient's Chart, lab work & pertinent test results  Airway Mallampati: II       Dental  (+) Partial Upper   Pulmonary neg pulmonary ROS,    breath sounds clear to auscultation       Cardiovascular negative cardio ROS Normal cardiovascular exam     Neuro/Psych    GI/Hepatic negative GI ROS, Neg liver ROS,   Endo/Other  Hypothyroidism   Renal/GU negative Renal ROS     Musculoskeletal negative musculoskeletal ROS (+)   Abdominal (+) + obese,   Peds negative pediatric ROS (+)  Hematology negative hematology ROS (+)   Anesthesia Other Findings   Reproductive/Obstetrics                             Anesthesia Physical Anesthesia Plan  ASA: II  Anesthesia Plan: General   Post-op Pain Management:    Induction: Intravenous  Airway Management Planned: Nasal Cannula  Additional Equipment:   Intra-op Plan:   Post-operative Plan:   Informed Consent: I have reviewed the patients History and Physical, chart, labs and discussed the procedure including the risks, benefits and alternatives for the proposed anesthesia with the patient or authorized representative who has indicated his/her understanding and acceptance.     Plan Discussed with: CRNA  Anesthesia Plan Comments:         Anesthesia Quick Evaluation

## 2015-04-18 NOTE — Op Note (Signed)
[facility] Gastroenterology Patient Name: Katie Keith Procedure Date: 04/18/2015 8:07 AM MRN: [id number] Account #: [id number] Date of Birth: [birth year] Admit Type: Outpatient Age: 69 Room: [facility] ENDO ROOM 1 Gender: Female Note Status: Finalized Procedure:         Colonoscopy Indications:       Screening for colorectal malignant neoplasm Providers:         Robert Bellow, MD Referring MD:      Janine Ores. Rosanna Randy, MD (Referring MD) Medicines:         Monitored Anesthesia Care Complications:     No immediate complications. Procedure:         Pre-Anesthesia Assessment:                    - Prior to the procedure, a History and Physical was                     performed, and patient medications, allergies and                     sensitivities were reviewed. The patient's tolerance of                     previous anesthesia was reviewed.                    - The risks and benefits of the procedure and the sedation                     options and risks were discussed with the patient. All                     questions were answered and informed consent was obtained.                    After obtaining informed consent, the colonoscope was                     passed under direct vision. Throughout the procedure, the                     patient's blood pressure, pulse, and oxygen saturations                     were monitored continuously. The Colonoscope was                     introduced through the anus and advanced to the the cecum,                     identified by the appendiceal orifice, IC valve and                     transillumination. The colonoscopy was somewhat difficult                     due to multiple diverticula in the colon. Successful                     completion of the procedure was aided by changing the                     patient to a reverse Trendelenberg posiion. The patient  tolerated the procedure well. The quality of  the bowel                     preparation was excellent. Findings:      Many medium-mouthed diverticula were found in the sigmoid colon.      A 6 mm polyp was found in the transverse colon. The polyp was sessile.       Biopsies were taken with a cold forceps for histology.      The retroflexed view of the distal rectum and anal verge was normal and       showed no anal or rectal abnormalities. Impression:        - Diverticulosis in the sigmoid colon.                    - One 6 mm polyp in the transverse colon. Biopsied.                    - The distal rectum and anal verge are normal on                     retroflexion view. Recommendation:    - Telephone endoscopist for pathology results in 1 week. Procedure Code(s): --- Professional ---                    [phone], Colonoscopy, flexible; with biopsy, single or                     multiple Diagnosis Code(s): --- Professional ---                    Z12.11, Encounter for screening for malignant neoplasm of                     colon                    D12.3, Benign neoplasm of transverse colon                    K57.30, Diverticulosis of large intestine without                     perforation or abscess without bleeding CPT copyright 2014 American Medical Association. All rights reserved. The codes documented in this report are preliminary and upon coder review may  be revised to meet current compliance requirements. Robert Bellow, MD 04/18/2015 8:48:57 AM This report has been signed electronically. Number of Addenda: 0 Note Initiated On: 04/18/2015 8:07 AM Scope Withdrawal Time: 0 hours 5 minutes 12 seconds  Total Procedure Duration: 0 hours 34 minutes 15 seconds       [facility]

## 2015-04-18 NOTE — Anesthesia Postprocedure Evaluation (Signed)
  Anesthesia Post-op Note  Patient: Katie Keith  Procedure(s) Performed: Procedure(s): COLONOSCOPY WITH PROPOFOL (N/A)  Anesthesia type:General  Patient location: PACU  Post pain: Pain level controlled  Post assessment: Post-op Vital signs reviewed, Patient's Cardiovascular Status Stable, Respiratory Function Stable, Patent Airway and No signs of Nausea or vomiting  Post vital signs: Reviewed and stable  Last Vitals:  Filed Vitals:   04/18/15 0900  BP: 119/78  Pulse: 74  Temp:   Resp: 11    Level of consciousness: awake, alert  and patient cooperative  Complications: No apparent anesthesia complications

## 2015-04-18 NOTE — Transfer of Care (Signed)
Immediate Anesthesia Transfer of Care Note  Patient: Katie Keith  Procedure(s) Performed: Procedure(s): COLONOSCOPY WITH PROPOFOL (N/A)  Patient Location: PACU  Anesthesia Type:General  Level of Consciousness: sedated  Airway & Oxygen Therapy: Patient Spontanous Breathing and Patient connected to nasal cannula oxygen  Post-op Assessment: Report given to RN and Post -op Vital signs reviewed and stable  Post vital signs: Reviewed and stable  Last Vitals:  Filed Vitals:   04/18/15 0719  BP: 135/98  Pulse: 84  Temp: 37 C  Resp: 18    Complications: No apparent anesthesia complications

## 2015-04-19 ENCOUNTER — Telehealth: Payer: Self-pay | Admitting: General Surgery

## 2015-04-19 LAB — SURGICAL PATHOLOGY

## 2015-04-19 NOTE — Telephone Encounter (Signed)
The patient was notified that the polyp removed was benign but does warrant a follow-up exam in 5 years.

## 2015-04-23 ENCOUNTER — Encounter: Payer: Self-pay | Admitting: General Surgery

## 2015-04-30 ENCOUNTER — Other Ambulatory Visit: Payer: Self-pay | Admitting: Family Medicine

## 2015-05-02 ENCOUNTER — Ambulatory Visit (INDEPENDENT_AMBULATORY_CARE_PROVIDER_SITE_OTHER): Payer: PPO

## 2015-05-02 DIAGNOSIS — E538 Deficiency of other specified B group vitamins: Secondary | ICD-10-CM | POA: Diagnosis not present

## 2015-05-02 MED ORDER — CYANOCOBALAMIN 1000 MCG/ML IJ SOLN
1000.0000 ug | Freq: Once | INTRAMUSCULAR | Status: AC
  Administered 2015-05-02: 1000 ug via INTRAMUSCULAR

## 2015-05-02 NOTE — Progress Notes (Signed)
Patient ID: Katie Keith, female   DOB: September 12, 1945, 69 y.o.   MRN: [id number] Patient received Vitamin B12 injection today. Patient tolerated injection well. Return in 3 weeks.

## 2015-05-04 ENCOUNTER — Ambulatory Visit: Payer: Self-pay

## 2015-05-23 ENCOUNTER — Ambulatory Visit (INDEPENDENT_AMBULATORY_CARE_PROVIDER_SITE_OTHER): Payer: PPO

## 2015-05-23 DIAGNOSIS — E538 Deficiency of other specified B group vitamins: Secondary | ICD-10-CM

## 2015-05-23 MED ORDER — CYANOCOBALAMIN 1000 MCG/ML IJ SOLN
1000.0000 ug | Freq: Once | INTRAMUSCULAR | Status: AC
  Administered 2015-05-23: 1000 ug via INTRAMUSCULAR

## 2015-06-13 ENCOUNTER — Ambulatory Visit (INDEPENDENT_AMBULATORY_CARE_PROVIDER_SITE_OTHER): Payer: PPO

## 2015-06-13 DIAGNOSIS — E538 Deficiency of other specified B group vitamins: Secondary | ICD-10-CM | POA: Diagnosis not present

## 2015-06-13 MED ORDER — CYANOCOBALAMIN 1000 MCG/ML IJ SOLN
1000.0000 ug | Freq: Once | INTRAMUSCULAR | Status: AC
  Administered 2015-06-13: 1000 ug via INTRAMUSCULAR

## 2015-06-27 ENCOUNTER — Encounter: Payer: Self-pay | Admitting: General Surgery

## 2015-07-04 ENCOUNTER — Encounter: Payer: Self-pay | Admitting: Family Medicine

## 2015-07-04 ENCOUNTER — Ambulatory Visit (INDEPENDENT_AMBULATORY_CARE_PROVIDER_SITE_OTHER): Payer: PPO | Admitting: Family Medicine

## 2015-07-04 VITALS — BP 118/70 | HR 92 | Temp 98.3°F | Resp 16 | Wt 151.0 lb

## 2015-07-04 DIAGNOSIS — L739 Follicular disorder, unspecified: Secondary | ICD-10-CM

## 2015-07-04 DIAGNOSIS — K5792 Diverticulitis of intestine, part unspecified, without perforation or abscess without bleeding: Secondary | ICD-10-CM | POA: Diagnosis not present

## 2015-07-04 DIAGNOSIS — E538 Deficiency of other specified B group vitamins: Secondary | ICD-10-CM

## 2015-07-04 DIAGNOSIS — R109 Unspecified abdominal pain: Secondary | ICD-10-CM | POA: Diagnosis not present

## 2015-07-04 DIAGNOSIS — I10 Essential (primary) hypertension: Secondary | ICD-10-CM

## 2015-07-04 DIAGNOSIS — N3000 Acute cystitis without hematuria: Secondary | ICD-10-CM

## 2015-07-04 DIAGNOSIS — E039 Hypothyroidism, unspecified: Secondary | ICD-10-CM | POA: Diagnosis not present

## 2015-07-04 DIAGNOSIS — R634 Abnormal weight loss: Secondary | ICD-10-CM | POA: Diagnosis not present

## 2015-07-04 LAB — POCT URINALYSIS DIPSTICK
GLUCOSE UA: NEGATIVE
Ketones, UA: NEGATIVE
LEUKOCYTES UA: NEGATIVE
NITRITE UA: NEGATIVE
Protein, UA: NEGATIVE
RBC UA: NEGATIVE
Spec Grav, UA: 1.02
Urobilinogen, UA: 0.2
pH, UA: 5

## 2015-07-04 MED ORDER — CYANOCOBALAMIN 1000 MCG/ML IJ SOLN
1000.0000 ug | Freq: Once | INTRAMUSCULAR | Status: AC
  Administered 2015-07-04: 1000 ug via INTRAMUSCULAR

## 2015-07-04 MED ORDER — DOXYCYCLINE HYCLATE 100 MG PO TABS: 100.0000 mg | ORAL_TABLET | Freq: Two times a day (BID) | ORAL | Status: DC

## 2015-07-04 NOTE — Progress Notes (Signed)
Subjective:    Patient ID: Katie Keith, female    DOB: 1945/10/03, 70 y.o.   MRN: [id number]  Hypertension This is a chronic problem. The problem is controlled. Associated symptoms include anxiety, blurred vision and malaise/fatigue. Pertinent negatives include no chest pain, headaches, neck pain, orthopnea, palpitations, peripheral edema, shortness of breath or sweats. Treatments tried: Coreg 6.25 mg BID. The current treatment provides moderate improvement. There are no compliance problems.   Hypothyroidism Katie Keith is a 70 y.o. female who presents for follow up of hypothyroidism. Current symptoms: change in energy level, heat / cold intolerance, nervousness and weight changes . Patient denies diarrhea and palpitations. Symptoms have waxed and waned but are worse overall. Pt currently taking Levothyroxine 112 mcg. Vitamin B12 Deficiency Pt needs B12 injection. B12 and Folate have not been checked since 09/03/2011. Skin Problem Pt reports she has a wound on the back of her right calf, which has been present since September. Pt is concerned this may have been tick bite, and is concerned it could be Lyme's Disease. Abdominal Pain Since colonoscopy in November. Pt has a H/O diverticulitis. Pt denies diarrhea. Pt c/o loss of appetite, weight loss, nausea. Pain is located in suprapubic area. Pt c/o dysuria, urgency since Saturday.   Review of Systems  Constitutional: Positive for malaise/fatigue.  Eyes: Positive for blurred vision.  Respiratory: Negative for shortness of breath.   Cardiovascular: Negative for chest pain, palpitations and orthopnea.  Musculoskeletal: Negative for neck pain.  Neurological: Negative for headaches.   BP 118/70 mmHg  Pulse 92  Temp(Src) 98.3 F (36.8 C) (Oral)  Resp 16  Wt 151 lb (68.493 kg)   Patient Active Problem List   Diagnosis Date Noted  . Eustachian tube dysfunction 03/16/2015  . Encounter for screening colonoscopy 02/22/2015  .  History of breast cancer 02/22/2015  . Vitamin B12 deficiency 02/16/2015  . Generalized abdominal pain 10/04/2014  . Left-sided thoracic back pain 10/04/2014   Past Medical History  Diagnosis Date  . Thyroid disease   . Migraines   . Diverticulosis 2006  . Peripheral neuropathy ([place])   . Cancer ([place]) 1992    DCIS left breast, treated with wide excision.  . Hypothyroidism    Current Outpatient Prescriptions on File Prior to Visit  Medication Sig  . aspirin 81 MG tablet Take 81 mg by mouth daily.  . Biotin 5000 MCG CAPS Take by mouth daily.  . Calcium Carbonate-Vitamin D (CALCIUM + D PO) Take by mouth daily.  . carvedilol (COREG) 6.25 MG tablet TAKE ONE TABLET BY MOUTH TWICE DAILY  . CINNAMON PO Take by mouth daily.  Marland Kitchen levothyroxine (SYNTHROID, LEVOTHROID) 112 MCG tablet Take 1 tablet (112 mcg total) by mouth daily before breakfast.   No current facility-administered medications on file prior to visit.   Allergies  Allergen Reactions  . Penicillins Swelling  . Sulfa Antibiotics Rash   Past Surgical History  Procedure Laterality Date  . Hernia repair    . Colonoscopy  2006    Dr Bary Castilla  . Thumb surgery      injury with infection  . Breast surgery Left 1992    wide excision for DCIS  . Colonoscopy with propofol N/A 04/18/2015    Procedure: COLONOSCOPY WITH PROPOFOL;  Surgeon: Robert Bellow, MD;  Location: [facility] ENDOSCOPY;  Service: Endoscopy;  Laterality: N/A;   Social History   Social History  . Marital Status: Married    Spouse Name: N/A  . Number of  Children: N/A  . Years of Education: N/A   Occupational History  . Not on file.   Social History Main Topics  . Smoking status: Never Smoker   . Smokeless tobacco: Never Used  . Alcohol Use: No  . Drug Use: No  . Sexual Activity: Not on file   Other Topics Concern  . Not on file   Social History Narrative   Family History  Problem Relation Age of Onset  . Breast cancer Mother   . Diabetes Mother   .  Lung cancer Father   . Cancer Sister 66    breast      Objective:   Physical Exam  Constitutional: She is oriented to person, place, and time. She appears well-developed and well-nourished.  HENT:  Right Ear: External ear normal.  Left Ear: External ear normal.  Nose: Nose normal.  Mouth/Throat: Oropharynx is clear and moist.  Eyes: Conjunctivae are normal.  Neck: Neck supple.  Cardiovascular: Normal rate, regular rhythm and normal heart sounds.   Pulmonary/Chest: Effort normal and breath sounds normal.  Abdominal: There is tenderness (mild suprapubic tenderness  ).  Neurological: She is alert and oriented to person, place, and time.  Skin: Skin is warm and dry.  Infected abrasion lower right calf.  Psychiatric: She has a normal mood and affect. Her behavior is normal. Judgment and thought content normal.          Assessment & Plan:  1. Vitamin B12 deficiency B12 injection administered today. Check labs as below. FU pending results. - cyanocobalamin ((VITAMIN B-12)) injection 1,000 mcg; Inject 1 mL (1,000 mcg total) into the muscle once. - Vitamin B12  2. Essential hypertension Stable. Check labs as below. FU pending results. - CBC with Differential/Platelet - Comprehensive metabolic panel  3. Hypothyroidism, unspecified hypothyroidism type FU pending results. - TSH  4. Folliculitis Advised pt not to shave over area, will start Doxy. May need dermatology referral.read as folliculitis with Doxy. 5. Diverticulitis of intestine without perforation or abscess without bleeding Presumed. Will start Doxy for Diverticulitis or UTI. Will also treat folliculitis with Doxy. - doxycycline (VIBRA-TABS) 100 MG tablet; Take 1 tablet (100 mg total) by mouth 2 (two) times daily.  Dispense: 14 tablet; Refill: 0  6. Abdominal pain, unspecified abdominal location Question UTI vs diverticulitis. FU pending labs. - Sedimentation rate  7. Acute cystitis without hematuria Send urine  for cx, treat with Doxy. - POCT urinalysis dipstick - Urine culture   8. Weight loss Due to ABD pain; FU pending results.  Patient seen and examined by Miguel Aschoff, MD, and note scribed by Renaldo Fiddler, CMA.

## 2015-07-05 LAB — CBC WITH DIFFERENTIAL/PLATELET
BASOS ABS: 0 10*3/uL (ref 0.0–0.2)
Basos: 0 %
EOS (ABSOLUTE): 0.4 10*3/uL (ref 0.0–0.4)
Eos: 5 %
Hematocrit: 33.3 % — ABNORMAL LOW (ref 34.0–46.6)
Hemoglobin: 10.6 g/dL — ABNORMAL LOW (ref 11.1–15.9)
IMMATURE GRANS (ABS): 0 10*3/uL (ref 0.0–0.1)
Immature Granulocytes: 0 %
LYMPHS: 19 %
Lymphocytes Absolute: 1.7 10*3/uL (ref 0.7–3.1)
MCH: 23.6 pg — AB (ref 26.6–33.0)
MCHC: 31.8 g/dL (ref 31.5–35.7)
MCV: 74 fL — ABNORMAL LOW (ref 79–97)
Monocytes Absolute: 1.1 10*3/uL — ABNORMAL HIGH (ref 0.1–0.9)
Monocytes: 12 %
NEUTROS ABS: 5.7 10*3/uL (ref 1.4–7.0)
NEUTROS PCT: 64 %
PLATELETS: 388 10*3/uL — AB (ref 150–379)
RBC: 4.5 x10E6/uL (ref 3.77–5.28)
RDW: 15.6 % — ABNORMAL HIGH (ref 12.3–15.4)
WBC: 9 10*3/uL (ref 3.4–10.8)

## 2015-07-05 LAB — COMPREHENSIVE METABOLIC PANEL
ALK PHOS: 88 IU/L (ref 39–117)
ALT: 15 IU/L (ref 0–32)
AST: 22 IU/L (ref 0–40)
Albumin/Globulin Ratio: 1.3 (ref 1.1–2.5)
Albumin: 4 g/dL (ref 3.5–4.8)
BILIRUBIN TOTAL: 0.4 mg/dL (ref 0.0–1.2)
BUN/Creatinine Ratio: 19 (ref 11–26)
BUN: 15 mg/dL (ref 8–27)
CHLORIDE: 97 mmol/L (ref 96–106)
CO2: 21 mmol/L (ref 18–29)
Calcium: 9.3 mg/dL (ref 8.7–10.3)
Creatinine, Ser: 0.79 mg/dL (ref 0.57–1.00)
GFR calc non Af Amer: 76 mL/min/{1.73_m2} (ref >59)
GFR, EST AFRICAN AMERICAN: 87 mL/min/{1.73_m2} (ref >59)
GLUCOSE: 103 mg/dL — AB (ref 65–99)
Globulin, Total: 3.1 g/dL (ref 1.5–4.5)
Potassium: 4.7 mmol/L (ref 3.5–5.2)
Sodium: 135 mmol/L (ref 134–144)
TOTAL PROTEIN: 7.1 g/dL (ref 6.0–8.5)

## 2015-07-05 LAB — URINE CULTURE: Organism ID, Bacteria: NO GROWTH

## 2015-07-05 LAB — VITAMIN B12

## 2015-07-05 LAB — TSH: TSH: 1.4 u[IU]/mL (ref 0.450–4.500)

## 2015-07-05 LAB — SEDIMENTATION RATE: SED RATE: 26 mm/h (ref 0–40)

## 2015-07-10 ENCOUNTER — Telehealth: Payer: Self-pay

## 2015-07-10 DIAGNOSIS — D649 Anemia, unspecified: Secondary | ICD-10-CM

## 2015-07-10 NOTE — Telephone Encounter (Signed)
-----   Message from Jerrol Banana., MD sent at 07/10/2015  7:43 AM EST ----- Repeat CBC with iron studies this week and then see me in the next few weeks. She is mildly anemic and we didn't need to discuss workup.

## 2015-07-10 NOTE — Telephone Encounter (Signed)
Left message to call back  

## 2015-07-11 NOTE — Telephone Encounter (Signed)
Advised patient as below. Patient reports that she will have labs done next week and she has an appt scheduled on 07/25/15.

## 2015-07-16 DIAGNOSIS — D649 Anemia, unspecified: Secondary | ICD-10-CM | POA: Diagnosis not present

## 2015-07-17 ENCOUNTER — Telehealth: Payer: Self-pay

## 2015-07-17 LAB — CBC WITH DIFFERENTIAL/PLATELET
Basophils Absolute: 0 10*3/uL (ref 0.0–0.2)
Basos: 0 %
EOS (ABSOLUTE): 0.3 10*3/uL (ref 0.0–0.4)
EOS: 4 %
HEMATOCRIT: 36.3 % (ref 34.0–46.6)
HEMOGLOBIN: 11 g/dL — AB (ref 11.1–15.9)
IMMATURE GRANS (ABS): 0 10*3/uL (ref 0.0–0.1)
IMMATURE GRANULOCYTES: 0 %
LYMPHS ABS: 1.8 10*3/uL (ref 0.7–3.1)
LYMPHS: 21 %
MCH: 22.4 pg — ABNORMAL LOW (ref 26.6–33.0)
MCHC: 30.3 g/dL — ABNORMAL LOW (ref 31.5–35.7)
MCV: 74 fL — ABNORMAL LOW (ref 79–97)
MONOCYTES: 7 %
Monocytes Absolute: 0.6 10*3/uL (ref 0.1–0.9)
NEUTROS PCT: 68 %
Neutrophils Absolute: 5.6 10*3/uL (ref 1.4–7.0)
Platelets: 370 10*3/uL (ref 150–379)
RBC: 4.9 x10E6/uL (ref 3.77–5.28)
RDW: 16.1 % — ABNORMAL HIGH (ref 12.3–15.4)
WBC: 8.3 10*3/uL (ref 3.4–10.8)

## 2015-07-17 NOTE — Telephone Encounter (Signed)
Dr. Darnell Level, patient has an appt scheduled with you next week on 07/25/15 to discuss anemia. Do you want her to cancel that appt and then see you a month from now? Please advise. Thanks!

## 2015-07-17 NOTE — Telephone Encounter (Signed)
-----   Message from Jerrol Banana., MD sent at 07/17/2015  8:21 AM EST ----- I think this is already been filed visit/message. Have patient see me back in a month and a week before that the obtain CBC and iron studies.

## 2015-07-17 NOTE — Telephone Encounter (Signed)
Next week is great.

## 2015-07-25 ENCOUNTER — Ambulatory Visit (INDEPENDENT_AMBULATORY_CARE_PROVIDER_SITE_OTHER): Payer: PPO | Admitting: Family Medicine

## 2015-07-25 VITALS — BP 140/62 | HR 72 | Temp 98.1°F | Resp 16 | Wt 150.0 lb

## 2015-07-25 DIAGNOSIS — K5792 Diverticulitis of intestine, part unspecified, without perforation or abscess without bleeding: Secondary | ICD-10-CM

## 2015-07-25 DIAGNOSIS — E538 Deficiency of other specified B group vitamins: Secondary | ICD-10-CM

## 2015-07-25 DIAGNOSIS — L739 Follicular disorder, unspecified: Secondary | ICD-10-CM | POA: Diagnosis not present

## 2015-07-25 DIAGNOSIS — J069 Acute upper respiratory infection, unspecified: Secondary | ICD-10-CM

## 2015-07-25 MED ORDER — CYANOCOBALAMIN 1000 MCG/ML IJ SOLN
1000.0000 ug | Freq: Once | INTRAMUSCULAR | Status: AC
  Administered 2015-07-25: 1000 ug via INTRAMUSCULAR

## 2015-07-25 MED ORDER — AZITHROMYCIN 250 MG PO TABS: ORAL_TABLET | ORAL | Status: DC

## 2015-07-25 NOTE — Progress Notes (Signed)
Patient ID: IDALIZ PENNACCHIO, female   DOB: 02-28-1946, 70 y.o.   MRN: [id number]   SHAMONDA CARBONARO  MRN: [id number] DOB: Aug 02, 1945  Subjective:  HPI   1. Diverticulitis of intestine without perforation or abscess without bleeding Patient is a 70 year old female who presents for follow up of her episode of diverticulitis.  She was placed on Doxycycline last month and states that she has gotten better.  She has also made some changes in her diet that have helped improve her abdominal pain.   2. Folliculitis The patient states that she has had no improvement of the spot that she has on her leg.  She states that it is still sore, itchy and causes pain in her leg.  She doesn't scratch the itch because the site is so tender.  She also feels that there is a divet in the area of the sore.  She thought it may have been a tick bite, but now wonders now if it could have been a spider bite.  3. Upper respiratory infection The patient states she started having a cough 2 weeks ago.  She states she started having symptoms around the time she was finishing her antibiotics.  She did have some chills, no fever that she could tell, no SOB or chest pain.  Last night she feels she began with some wheezing and still has a very bad cough.   Patient Active Problem List   Diagnosis Date Noted  . Hypertension 07/04/2015  . Hypothyroidism 07/04/2015  . Eustachian tube dysfunction 03/16/2015  . Encounter for screening colonoscopy 02/22/2015  . History of breast cancer 02/22/2015  . Vitamin B12 deficiency 02/16/2015  . Generalized abdominal pain 10/04/2014  . Left-sided thoracic back pain 10/04/2014    Past Medical History  Diagnosis Date  . Thyroid disease   . Migraines   . Diverticulosis 2006  . Peripheral neuropathy ([place])   . Cancer ([place]) 1992    DCIS left breast, treated with wide excision.  . Hypothyroidism     Social History   Social History  . Marital Status: Married    Spouse Name: N/A  .  Number of Children: N/A  . Years of Education: N/A   Occupational History  . Not on file.   Social History Main Topics  . Smoking status: Never Smoker   . Smokeless tobacco: Never Used  . Alcohol Use: No  . Drug Use: No  . Sexual Activity: Not on file   Other Topics Concern  . Not on file   Social History Narrative    Outpatient Prescriptions Prior to Visit  Medication Sig Dispense Refill  . aspirin 81 MG tablet Take 81 mg by mouth daily.    . Biotin 5000 MCG CAPS Take by mouth daily.    . Calcium Carbonate-Vitamin D (CALCIUM + D PO) Take by mouth daily.    . carvedilol (COREG) 6.25 MG tablet TAKE ONE TABLET BY MOUTH TWICE DAILY 60 tablet 12  . CINNAMON PO Take by mouth daily.    Marland Kitchen levothyroxine (SYNTHROID, LEVOTHROID) 112 MCG tablet Take 1 tablet (112 mcg total) by mouth daily before breakfast. 30 tablet 12  . doxycycline (VIBRA-TABS) 100 MG tablet Take 1 tablet (100 mg total) by mouth 2 (two) times daily. 14 tablet 0   No facility-administered medications prior to visit.    Allergies  Allergen Reactions  . Penicillins Swelling  . Sulfa Antibiotics Rash    Review of Systems  Constitutional: Positive for chills  and malaise/fatigue. Negative for fever.  HENT: Positive for congestion. Negative for ear discharge, ear pain, hearing loss, nosebleeds, sore throat and tinnitus.   Eyes: Negative for blurred vision, double vision, photophobia, pain, discharge and redness.  Respiratory: Positive for cough and wheezing. Negative for hemoptysis, sputum production and shortness of breath.   Cardiovascular: Negative for chest pain, palpitations, orthopnea and leg swelling.  Gastrointestinal: Negative for heartburn, nausea, vomiting, abdominal pain, diarrhea, constipation and blood in stool.  Neurological: Negative for weakness and headaches.  Psychiatric/Behavioral: Negative.    Objective:  BP 140/62 mmHg  Pulse 72  Temp(Src) 98.1 F (36.7 C) (Oral)  Resp 16  Wt 150 lb (68.04  kg)  Physical Exam  Constitutional: She is well-developed, well-nourished, and in no distress.  HENT:  Head: Normocephalic.  Neck: Normal range of motion.  Cardiovascular: Normal rate, regular rhythm and normal heart sounds.   Pulmonary/Chest: Effort normal and breath sounds normal.  Abdominal: Soft.  Skin: Skin is warm and dry.  85mm linear area of induration with scab at distal end  Psychiatric: Mood, memory, affect and judgment normal.    Assessment and Plan :   1. Diverticulitis of intestine without perforation or abscess without bleeding   2. Folliculitis Could be ol spider bite. - Ambulatory referral to Dermatology  3. Upper respiratory infection Discussed likely viral etiology. - azithromycin (ZITHROMAX) 250 MG tablet; 2 po day 1, then 1 daily day 2-5  Dispense: 6 each; Refill: 0  4. B12 deficiency Shot every 3 weeks. - cyanocobalamin ((VITAMIN B-12)) injection 1,000 mcg; Inject 1 mL (1,000 mcg total) into the muscle once. 5.Hypothyroid I have done the exam and reviewed the above chart and it is accurate to the best of my knowledge.   Miguel Aschoff MD [facility] 07/25/2015 3:06 PM

## 2015-08-15 ENCOUNTER — Ambulatory Visit (INDEPENDENT_AMBULATORY_CARE_PROVIDER_SITE_OTHER): Payer: PPO

## 2015-08-15 DIAGNOSIS — E538 Deficiency of other specified B group vitamins: Secondary | ICD-10-CM

## 2015-08-15 MED ORDER — CYANOCOBALAMIN 1000 MCG/ML IJ SOLN
1000.0000 ug | Freq: Once | INTRAMUSCULAR | Status: AC
  Administered 2015-08-15: 1000 ug via INTRAMUSCULAR

## 2015-09-05 ENCOUNTER — Ambulatory Visit (INDEPENDENT_AMBULATORY_CARE_PROVIDER_SITE_OTHER): Payer: PPO

## 2015-09-05 DIAGNOSIS — E538 Deficiency of other specified B group vitamins: Secondary | ICD-10-CM | POA: Diagnosis not present

## 2015-09-05 MED ORDER — CYANOCOBALAMIN 1000 MCG/ML IJ SOLN
1000.0000 ug | Freq: Once | INTRAMUSCULAR | Status: AC
  Administered 2015-09-05: 1000 ug via INTRAMUSCULAR

## 2015-09-21 ENCOUNTER — Other Ambulatory Visit: Payer: Self-pay | Admitting: Family Medicine

## 2015-09-21 DIAGNOSIS — Z1231 Encounter for screening mammogram for malignant neoplasm of breast: Secondary | ICD-10-CM

## 2015-09-25 ENCOUNTER — Ambulatory Visit
Admission: RE | Admit: 2015-09-25 | Discharge: 2015-09-25 | Disposition: A | Discharge status: Home / Self Care | Payer: PPO | Source: Ambulatory Visit | Attending: Family Medicine | Admitting: Family Medicine

## 2015-09-25 DIAGNOSIS — Z1231 Encounter for screening mammogram for malignant neoplasm of breast: Secondary | ICD-10-CM | POA: Diagnosis not present

## 2015-09-25 HISTORY — DX: Malignant neoplasm of unspecified site of unspecified female breast: C50.919

## 2015-09-26 ENCOUNTER — Ambulatory Visit (INDEPENDENT_AMBULATORY_CARE_PROVIDER_SITE_OTHER): Payer: PPO

## 2015-09-26 DIAGNOSIS — E538 Deficiency of other specified B group vitamins: Secondary | ICD-10-CM

## 2015-09-26 MED ORDER — CYANOCOBALAMIN 1000 MCG/ML IJ SOLN
1000.0000 ug | Freq: Once | INTRAMUSCULAR | Status: AC
  Administered 2015-09-26: 1000 ug via INTRAMUSCULAR

## 2015-10-09 DIAGNOSIS — L821 Other seborrheic keratosis: Secondary | ICD-10-CM | POA: Diagnosis not present

## 2015-10-09 DIAGNOSIS — D239 Other benign neoplasm of skin, unspecified: Secondary | ICD-10-CM | POA: Diagnosis not present

## 2015-10-09 DIAGNOSIS — L905 Scar conditions and fibrosis of skin: Secondary | ICD-10-CM | POA: Diagnosis not present

## 2015-10-09 DIAGNOSIS — D1800 Hemangioma unspecified site: Secondary | ICD-10-CM | POA: Diagnosis not present

## 2015-10-11 DIAGNOSIS — H2513 Age-related nuclear cataract, bilateral: Secondary | ICD-10-CM | POA: Diagnosis not present

## 2015-10-18 ENCOUNTER — Ambulatory Visit: Payer: PPO | Admitting: Family Medicine

## 2015-10-19 ENCOUNTER — Ambulatory Visit (INDEPENDENT_AMBULATORY_CARE_PROVIDER_SITE_OTHER): Payer: PPO | Admitting: Family Medicine

## 2015-10-19 VITALS — BP 128/82 | HR 66 | Temp 98.3°F | Resp 14 | Wt 147.0 lb

## 2015-10-19 DIAGNOSIS — E781 Pure hyperglyceridemia: Secondary | ICD-10-CM

## 2015-10-19 DIAGNOSIS — E039 Hypothyroidism, unspecified: Secondary | ICD-10-CM | POA: Diagnosis not present

## 2015-10-19 DIAGNOSIS — G5793 Unspecified mononeuropathy of bilateral lower limbs: Secondary | ICD-10-CM | POA: Diagnosis not present

## 2015-10-19 DIAGNOSIS — D509 Iron deficiency anemia, unspecified: Secondary | ICD-10-CM

## 2015-10-19 DIAGNOSIS — E538 Deficiency of other specified B group vitamins: Secondary | ICD-10-CM | POA: Diagnosis not present

## 2015-10-19 MED ORDER — "NEEDLE (DISP) 25G X 1"" MISC": Status: AC

## 2015-10-19 MED ORDER — CYANOCOBALAMIN 1000 MCG/ML IJ SOLN
1000.0000 ug | Freq: Once | INTRAMUSCULAR | Status: AC
  Administered 2015-10-19: 1000 ug via INTRAMUSCULAR

## 2015-10-19 MED ORDER — SYRINGE (DISPOSABLE) 3 ML MISC: Status: AC

## 2015-10-19 MED ORDER — CYANOCOBALAMIN 1000 MCG/ML IJ SOLN
INTRAMUSCULAR | Status: DC
Start: 2015-10-19 — End: 2016-11-10

## 2015-10-19 NOTE — Progress Notes (Signed)
Patient ID: Katie Keith, female   DOB: 09-06-1945, 70 y.o.   MRN: [id number]    Subjective:  HPI  Patient is here for follow up.  Anemia: patient's last CBC was done on 07/16/15, per note then needs this repeated and iron studies. Lab Results  Component Value Date   WBC 8.3 07/16/2015   HGB 13.2 10/03/2014   HCT 36.3 07/16/2015   MCV 74* 07/16/2015   PLT 370 07/16/2015   Hyperlipidemia: patient is not on any medications for this. Lab Results  Component Value Date   CHOL 166 10/03/2014   HDL 27* 10/03/2014   LDLCALC 88 10/03/2014   TRIG 254* 10/03/2014   Patient needs B12 injection today.  Hypothyroidism:  Lab Results  Component Value Date   TSH 1.400 07/04/2015   Patient has burning/discomfort sensation in both feet, the symptoms get worse the closer she is due to Vitamin B12 injection.   Prior to Admission medications   Medication Sig Start Date End Date Taking? Authorizing Provider  aspirin 81 MG tablet Take 81 mg by mouth daily.    Historical Provider, MD  azithromycin (ZITHROMAX) 250 MG tablet 2 po day 1, then 1 daily day 2-5 07/25/15   Jerrol Banana., MD  Biotin 5000 MCG CAPS Take by mouth daily.    Historical Provider, MD  Calcium Carbonate-Vitamin D (CALCIUM + D PO) Take by mouth daily.    Historical Provider, MD  carvedilol (COREG) 6.25 MG tablet TAKE ONE TABLET BY MOUTH TWICE DAILY 04/30/15   Jerrol Banana., MD  CINNAMON PO Take by mouth daily.    Historical Provider, MD  levothyroxine (SYNTHROID, LEVOTHROID) 112 MCG tablet Take 1 tablet (112 mcg total) by mouth daily before breakfast. 11/27/14   Jerrol Banana., MD    Patient Active Problem List   Diagnosis Date Noted  . Hypertension 07/04/2015  . Hypothyroidism 07/04/2015  . Eustachian tube dysfunction 03/16/2015  . Encounter for screening colonoscopy 02/22/2015  . History of breast cancer 02/22/2015  . Vitamin B12 deficiency 02/16/2015  . Generalized abdominal pain 10/04/2014  .  Left-sided thoracic back pain 10/04/2014    Past Medical History  Diagnosis Date  . Thyroid disease   . Migraines   . Diverticulosis 2006  . Peripheral neuropathy ([place])   . Cancer ([place]) 1992    DCIS left breast, treated with wide excision.  . Hypothyroidism   . Breast cancer ([place]) 1992    LT LUMPECTOMY    Social History   Social History  . Marital Status: Married    Spouse Name: N/A  . Number of Children: N/A  . Years of Education: N/A   Occupational History  . Not on file.   Social History Main Topics  . Smoking status: Never Smoker   . Smokeless tobacco: Never Used  . Alcohol Use: No  . Drug Use: No  . Sexual Activity: Not on file   Other Topics Concern  . Not on file   Social History Narrative    Allergies  Allergen Reactions  . Penicillins Swelling  . Sulfa Antibiotics Rash    Review of Systems  Constitutional: Negative.   Eyes: Negative.   Respiratory: Negative.   Cardiovascular: Negative.   Gastrointestinal: Negative.   Musculoskeletal: Negative.   Skin: Negative.   Neurological: Positive for tingling (numbness in the feet).  Endo/Heme/Allergies: Negative.   Psychiatric/Behavioral: Negative.     Immunization History  Administered Date(s) Administered  . Tdap 05/19/2007  .  Zoster 07/19/2008   Objective:  BP 128/82 mmHg  Pulse 66  Temp(Src) 98.3 F (36.8 C)  Resp 14  Wt 147 lb (66.679 kg)  Physical Exam  Constitutional: She is oriented to person, place, and time and well-developed, well-nourished, and in no distress.  HENT:  Head: Normocephalic and atraumatic.  Right Ear: External ear normal.  Left Ear: External ear normal.  Nose: Nose normal.  Eyes: Conjunctivae are normal. Pupils are equal, round, and reactive to light.  Neck: Normal range of motion. Neck supple.  Cardiovascular: Normal rate, regular rhythm, normal heart sounds and intact distal pulses.   No murmur heard. Pulmonary/Chest: Effort normal and breath sounds normal.  No respiratory distress. She has no wheezes.  Abdominal: Soft.  Musculoskeletal: She exhibits no edema or tenderness.  Neurological: She is alert and oriented to person, place, and time.  Skin: Skin is warm and dry.  Psychiatric: Mood, memory, affect and judgment normal.    Lab Results  Component Value Date   WBC 8.3 07/16/2015   HGB 13.2 10/03/2014   HCT 36.3 07/16/2015   PLT 370 07/16/2015   GLUCOSE 103* 07/04/2015   CHOL 166 10/03/2014   TRIG 254* 10/03/2014   HDL 27* 10/03/2014   LDLCALC 88 10/03/2014   TSH 1.400 07/04/2015    CMP     Component Value Date/Time   NA 135 07/04/2015 1438   K 4.7 07/04/2015 1438   CL 97 07/04/2015 1438   CO2 21 07/04/2015 1438   GLUCOSE 103* 07/04/2015 1438   BUN 15 07/04/2015 1438   CREATININE 0.79 07/04/2015 1438   CREATININE 1.0 10/03/2014   CALCIUM 9.3 07/04/2015 1438   PROT 7.1 07/04/2015 1438   ALBUMIN 4.0 07/04/2015 1438   AST 22 07/04/2015 1438   ALT 15 07/04/2015 1438   ALKPHOS 88 07/04/2015 1438   BILITOT 0.4 07/04/2015 1438   GFRNONAA 76 07/04/2015 1438   GFRAA 87 07/04/2015 1438    Assessment and Plan :  1. Vitamin B12 deficiency Injection done in the office today.   after discussion with patient will change to every 2 weeks on her B12 injections. Presently she gets complete relief of her neuropathy after 2 weeks with the third week she is having some symptoms and her injections are presently every 3 weeks. Have offered endocrinology referral for this and patient declines. Her daughter is a Equities trader and will have her do the injections every 2 weeks at home. IM injections. Return to clinic in 3 months. May switch to by mouth treatment in 2018 but she is getting a very good response to parenteral therapy  More than 50% of this visit  spent in counseling regarding these issues 2. Anemia, unspecified anemia type Re check labs today, pending results.  3. Hypothyroidism, unspecified hypothyroidism type Stable on the  last visit.  4. Neuropathy of both feet ([place]) Discussed with patient this issue in details. Advised patient that we will provide RX for b12 vials and needles and syringe to administer at home every 2 weeks and see if symptoms get better. Advised patient we can refer her to endocrinologist for further work up but patient feels comfortable with just following here and continuing B12 injections for now.  It is likely that this neuropathy is secondary to B12 deficiency. There could be other contributing factors also.  5. Apparent acute T12 compression fracture in May 2012  BMD when appropriate  6. LS degenerative disc disease  Check Lipid, Iron, Folate, B12, CBC today.  Patient was seen and examined by Dr. Eulas Post and note was scribed by Theressa Millard, RMA.    Miguel Aschoff MD [facility] 10/19/2015 8:19 AM

## 2015-10-20 LAB — CBC WITH DIFFERENTIAL/PLATELET
BASOS: 0 %
Basophils Absolute: 0 10*3/uL (ref 0.0–0.2)
EOS (ABSOLUTE): 0.3 10*3/uL (ref 0.0–0.4)
EOS: 5 %
HEMATOCRIT: 39 % (ref 34.0–46.6)
HEMOGLOBIN: 11.9 g/dL (ref 11.1–15.9)
IMMATURE GRANULOCYTES: 0 %
Immature Grans (Abs): 0 10*3/uL (ref 0.0–0.1)
LYMPHS ABS: 1.5 10*3/uL (ref 0.7–3.1)
Lymphs: 23 %
MCH: 22.7 pg — ABNORMAL LOW (ref 26.6–33.0)
MCHC: 30.5 g/dL — ABNORMAL LOW (ref 31.5–35.7)
MCV: 74 fL — AB (ref 79–97)
MONOCYTES: 11 %
MONOS ABS: 0.7 10*3/uL (ref 0.1–0.9)
NEUTROS PCT: 61 %
Neutrophils Absolute: 3.9 10*3/uL (ref 1.4–7.0)
Platelets: 299 10*3/uL (ref 150–379)
RBC: 5.24 x10E6/uL (ref 3.77–5.28)
RDW: 18.7 % — AB (ref 12.3–15.4)
WBC: 6.4 10*3/uL (ref 3.4–10.8)

## 2015-10-20 LAB — LIPID PANEL WITH LDL/HDL RATIO
Cholesterol, Total: 154 mg/dL (ref 100–199)
HDL: 28 mg/dL — AB (ref >39)
LDL CALC: 82 mg/dL (ref 0–99)
LDL/HDL RATIO: 2.9 ratio (ref 0.0–3.2)
TRIGLYCERIDES: 220 mg/dL — AB (ref 0–149)
VLDL Cholesterol Cal: 44 mg/dL — ABNORMAL HIGH (ref 5–40)

## 2015-10-20 LAB — IRON: Iron: 50 ug/dL (ref 27–139)

## 2015-10-20 LAB — FOLATE: Folate: >20 ng/mL (ref >3.0)

## 2015-10-20 LAB — VITAMIN B12: Vitamin B-12: >2000 pg/mL — ABNORMAL HIGH (ref 211–946)

## 2015-10-23 ENCOUNTER — Telehealth: Payer: Self-pay

## 2015-10-23 NOTE — Telephone Encounter (Signed)
Left message to call back  

## 2015-10-23 NOTE — Telephone Encounter (Signed)
Pt advised-aa 

## 2015-10-23 NOTE — Telephone Encounter (Signed)
-----   Message from Jerrol Banana., MD sent at 10/22/2015  3:24 PM EDT ----- Labs okay.

## 2015-10-23 NOTE — Telephone Encounter (Signed)
Pt is returning call.  CB#[phone]/MW

## 2015-12-03 ENCOUNTER — Other Ambulatory Visit: Payer: Self-pay | Admitting: Family Medicine

## 2016-02-04 ENCOUNTER — Ambulatory Visit (INDEPENDENT_AMBULATORY_CARE_PROVIDER_SITE_OTHER): Payer: PPO | Admitting: Family Medicine

## 2016-02-04 VITALS — BP 124/72 | HR 72 | Temp 98.4°F | Resp 12 | Wt 146.0 lb

## 2016-02-04 DIAGNOSIS — E039 Hypothyroidism, unspecified: Secondary | ICD-10-CM | POA: Diagnosis not present

## 2016-02-04 DIAGNOSIS — D509 Iron deficiency anemia, unspecified: Secondary | ICD-10-CM | POA: Diagnosis not present

## 2016-02-04 DIAGNOSIS — G5793 Unspecified mononeuropathy of bilateral lower limbs: Secondary | ICD-10-CM | POA: Diagnosis not present

## 2016-02-04 DIAGNOSIS — T753XXA Motion sickness, initial encounter: Secondary | ICD-10-CM | POA: Diagnosis not present

## 2016-02-04 DIAGNOSIS — E538 Deficiency of other specified B group vitamins: Secondary | ICD-10-CM

## 2016-02-04 MED ORDER — SCOPOLAMINE 1 MG/3DAYS TD PT72: 1.0000 | MEDICATED_PATCH | TRANSDERMAL | 12 refills | Status: DC

## 2016-02-04 NOTE — Progress Notes (Signed)
Subjective:  HPI  Patient is here for 3 months follow up. On her last visit advised patient to change B12 injections to every 2 weeks and neuropathy symptoms have been in control with this regimen. Her daughter gives this injection to her.  Anemia: CBC and Iron level in May and it was better. She has been eating more turn up greens and raisins.  Patent is getting ready to travel and has to fly and would like to get a motion sickness patch.She is trying to visit all 50  states. Neck she is traveling to the states along [place] in the [place]  Prior to Admission medications   Medication Sig Start Date End Date Taking? Authorizing Provider  aspirin 81 MG tablet Take 81 mg by mouth daily.   Yes Historical Provider, MD  Biotin 5000 MCG CAPS Take by mouth daily.   Yes Historical Provider, MD  Calcium Carbonate-Vitamin D (CALCIUM + D PO) Take by mouth daily.   Yes Historical Provider, MD  carvedilol (COREG) 6.25 MG tablet TAKE ONE TABLET BY MOUTH TWICE DAILY 04/30/15  Yes Jerrol Banana., MD  CINNAMON PO Take by mouth daily.   Yes Historical Provider, MD  cyanocobalamin (,VITAMIN B-12,) 1000 MCG/ML injection Inject twice a month 10/19/15  Yes Hilde Churchman L Cranford Mon., MD  levothyroxine (SYNTHROID, LEVOTHROID) 112 MCG tablet TAKE ONE TABLET BY MOUTH ONCE DAILY BEFORE BREAKFAST 12/04/15  Yes Jerrol Banana., MD  NEEDLE, DISP, 25 G 25G X 1" MISC Inject B12 twice a month 10/19/15  Yes Arlene Brickel Maceo Pro., MD  OVER THE COUNTER MEDICATION Takes Alphoric acid OTC   Yes Historical Provider, MD  Potassium 75 MG TABS Take by mouth daily.   Yes Historical Provider, MD  Syringe, Disposable, 3 ML MISC Use to inject B12 twice a month 10/19/15  Yes Sandeep Delagarza L Cranford Mon., MD    Patient Active Problem List   Diagnosis Date Noted  . Hypertension 07/04/2015  . Hypothyroidism 07/04/2015  . Eustachian tube dysfunction 03/16/2015  . Encounter for screening colonoscopy 02/22/2015  . History  of breast cancer 02/22/2015  . Vitamin B12 deficiency 02/16/2015  . Generalized abdominal pain 10/04/2014  . Left-sided thoracic back pain 10/04/2014    Past Medical History:  Diagnosis Date  . Breast cancer ([place]) 1992   LT LUMPECTOMY  . Cancer ([place]) 1992   DCIS left breast, treated with wide excision.  . Diverticulosis 2006  . Hypothyroidism   . Migraines   . Peripheral neuropathy ([place])   . Thyroid disease     Social History   Social History  . Marital status: Married    Spouse name: N/A  . Number of children: N/A  . Years of education: N/A   Occupational History  . Not on file.   Social History Main Topics  . Smoking status: Never Smoker  . Smokeless tobacco: Never Used  . Alcohol use No  . Drug use: No  . Sexual activity: Not on file   Other Topics Concern  . Not on file   Social History Narrative  . No narrative on file    Allergies  Allergen Reactions  . Penicillins Swelling  . Sulfa Antibiotics Rash    Review of Systems  Constitutional: Negative.   Eyes: Negative.   Respiratory: Negative.   Cardiovascular: Negative.   Gastrointestinal: Negative.   Musculoskeletal: Negative.   Skin: Negative.   Neurological: Negative.   Endo/Heme/Allergies: Negative.   Psychiatric/Behavioral: Negative.  Immunization History  Administered Date(s) Administered  . Tdap 05/19/2007  . Zoster 07/19/2008   Objective:  BP 124/72   Pulse 72   Temp 98.4 F (36.9 C)   Resp 12   Wt 146 lb (66.2 kg)   BMI 25.86 kg/m   Physical Exam  Constitutional: She is oriented to person, place, and time and well-developed, well-nourished, and in no distress.  HENT:  Head: Normocephalic and atraumatic.  Right Ear: External ear normal.  Left Ear: External ear normal.  Nose: Nose normal.  Eyes: Conjunctivae are normal. Pupils are equal, round, and reactive to light.  Neck: Normal range of motion. Neck supple.  Cardiovascular: Normal rate, regular rhythm, normal heart  sounds and intact distal pulses.   No murmur heard. Pulmonary/Chest: Effort normal and breath sounds normal. No respiratory distress. She has no wheezes.  Abdominal: Soft.  Musculoskeletal: She exhibits no edema or tenderness.  Neurological: She is alert and oriented to person, place, and time.  Skin: Skin is warm and dry.  Psychiatric: Mood, memory, affect and judgment normal.    Lab Results  Component Value Date   WBC 6.4 10/19/2015   HGB 13.2 10/03/2014   HCT 39.0 10/19/2015   PLT 299 10/19/2015   GLUCOSE 103 (H) 07/04/2015   CHOL 154 10/19/2015   TRIG 220 (H) 10/19/2015   HDL 28 (L) 10/19/2015   LDLCALC 82 10/19/2015   TSH 1.400 07/04/2015    CMP     Component Value Date/Time   NA 135 07/04/2015 1438   K 4.7 07/04/2015 1438   CL 97 07/04/2015 1438   CO2 21 07/04/2015 1438   GLUCOSE 103 (H) 07/04/2015 1438   BUN 15 07/04/2015 1438   CREATININE 0.79 07/04/2015 1438   CALCIUM 9.3 07/04/2015 1438   PROT 7.1 07/04/2015 1438   ALBUMIN 4.0 07/04/2015 1438   AST 22 07/04/2015 1438   ALT 15 07/04/2015 1438   ALKPHOS 88 07/04/2015 1438   BILITOT 0.4 07/04/2015 1438   GFRNONAA 76 07/04/2015 1438   GFRAA 87 07/04/2015 1438    Assessment and Plan :  1. Vitamin B12 deficiency Stable. Continue B12 injections.  2. Anemia, iron deficiency Will re check levels today. If levels are low still will try OTC Iron supplements at that time. Pending results. - CBC w/Diff/Platelet - Iron If MCV is low or iron stores are low may need to start oral iron. 3. Hypothyroidism, unspecified hypothyroidism type - TSH  4. Neuropathy of both feet ([facility]) Resolved.Improved,. - CBC w/Diff/Platelet - Iron  5. Travel sickness, initial encounter Patient is going to travel. - scopolamine (TRANSDERM-SCOP, 1.5 MG,) 1 MG/3DAYS; Place 1 patch (1.5 mg total) onto the skin every 3 (three) days.  Dispense: 10 patch; Refill: 12 6. Diverticulosis 7. History of breast cancer Patient was seen and  examined by Dr. Eulas Post and note was scribed by Theressa Millard, [place].  Miguel Aschoff MD [facility] 02/04/2016 8:19 AM

## 2016-02-05 LAB — TSH: TSH: 0.462 u[IU]/mL (ref 0.450–4.500)

## 2016-02-05 LAB — CBC WITH DIFFERENTIAL/PLATELET
BASOS: 0 %
Basophils Absolute: 0 10*3/uL (ref 0.0–0.2)
EOS (ABSOLUTE): 0.4 10*3/uL (ref 0.0–0.4)
EOS: 6 %
HEMATOCRIT: 37.7 % (ref 34.0–46.6)
Hemoglobin: 11.6 g/dL (ref 11.1–15.9)
IMMATURE GRANULOCYTES: 0 %
Immature Grans (Abs): 0 10*3/uL (ref 0.0–0.1)
LYMPHS ABS: 1.3 10*3/uL (ref 0.7–3.1)
Lymphs: 23 %
MCH: 23.2 pg — AB (ref 26.6–33.0)
MCHC: 30.8 g/dL — ABNORMAL LOW (ref 31.5–35.7)
MCV: 75 fL — AB (ref 79–97)
MONOS ABS: 0.7 10*3/uL (ref 0.1–0.9)
Monocytes: 12 %
NEUTROS ABS: 3.1 10*3/uL (ref 1.4–7.0)
Neutrophils: 59 %
PLATELETS: 297 10*3/uL (ref 150–379)
RBC: 5 x10E6/uL (ref 3.77–5.28)
RDW: 18.2 % — AB (ref 12.3–15.4)
WBC: 5.4 10*3/uL (ref 3.4–10.8)

## 2016-02-05 LAB — IRON: IRON: 40 ug/dL (ref 27–139)

## 2016-03-20 ENCOUNTER — Ambulatory Visit (INDEPENDENT_AMBULATORY_CARE_PROVIDER_SITE_OTHER): Payer: PPO | Admitting: General Surgery

## 2016-03-20 ENCOUNTER — Encounter: Payer: Self-pay | Admitting: General Surgery

## 2016-03-20 ENCOUNTER — Inpatient Hospital Stay: Payer: Self-pay

## 2016-03-20 VITALS — BP 120/74 | HR 78 | Resp 12 | Ht 62.0 in | Wt 133.0 lb

## 2016-03-20 DIAGNOSIS — N6320 Unspecified lump in the left breast, unspecified quadrant: Secondary | ICD-10-CM

## 2016-03-20 DIAGNOSIS — N6324 Unspecified lump in the left breast, lower inner quadrant: Secondary | ICD-10-CM

## 2016-03-20 NOTE — Patient Instructions (Signed)
The patient is aware to call back for any questions or concerns.  

## 2016-03-20 NOTE — Progress Notes (Signed)
Patient ID: Katie Keith, female   DOB: 06-27-1945, 70 y.o.   MRN: [id number]  Chief Complaint  Patient presents with  . Other    lump in breast    HPI Katie Keith is a 70 y.o. female here today for a evaluation of a new lump in her left breast. Size of a "pea" Patient states she noticed this  last week. No change in size or pain. She states she has some itching on her left areola for several months.  Last mammogram was 09/25/15.   HPI  Past Medical History:  Diagnosis Date  . Breast cancer ([place]) 1992   LT LUMPECTOMY  . Cancer ([place]) 1992   DCIS left breast, treated with wide excision.  . Diverticulosis 2006  . Hypothyroidism   . Migraines   . Peripheral neuropathy ([place])   . Thyroid disease     Past Surgical History:  Procedure Laterality Date  . BREAST SURGERY Left 1992   wide excision for DCIS  . COLONOSCOPY  2006   Dr Bary Castilla  . COLONOSCOPY WITH PROPOFOL N/A 04/18/2015   Procedure: COLONOSCOPY WITH PROPOFOL;  Surgeon: Robert Bellow, MD;  Location: [facility] ENDOSCOPY;  Service: Endoscopy;  Laterality: N/A;  . HERNIA REPAIR    . thumb surgery     injury with infection    Family History  Problem Relation Age of Onset  . Breast cancer Mother     23'S  . Diabetes Mother   . Lung cancer Father   . Cancer Sister 70    breast  . Breast cancer Sister 40  . Breast cancer Paternal Grandmother     17'S    Social History Social History  Substance Use Topics  . Smoking status: Never Smoker  . Smokeless tobacco: Never Used  . Alcohol use No    Allergies  Allergen Reactions  . Penicillins Swelling  . Sulfa Antibiotics Rash    Current Outpatient Prescriptions  Medication Sig Dispense Refill  . aspirin 81 MG tablet Take 81 mg by mouth daily.    . Biotin 5000 MCG CAPS Take by mouth daily.    . Calcium Carbonate-Vitamin D (CALCIUM + D PO) Take by mouth daily.    . carvedilol (COREG) 6.25 MG tablet TAKE ONE TABLET BY MOUTH TWICE DAILY 60 tablet 12  . CINNAMON  PO Take by mouth daily.    . cyanocobalamin (,VITAMIN B-12,) 1000 MCG/ML injection Inject twice a month 10 mL 12  . levothyroxine (SYNTHROID, LEVOTHROID) 112 MCG tablet TAKE ONE TABLET BY MOUTH ONCE DAILY BEFORE BREAKFAST 30 tablet 12  . NEEDLE, DISP, 25 G 25G X 1" MISC Inject B12 twice a month 10 each 12  . OVER THE COUNTER MEDICATION Takes Alphoric acid OTC    . Potassium 75 MG TABS Take by mouth daily.    Marland Kitchen scopolamine (TRANSDERM-SCOP, 1.5 MG,) 1 MG/3DAYS Place 1 patch (1.5 mg total) onto the skin every 3 (three) days. 10 patch 12  . Syringe, Disposable, 3 ML MISC Use to inject B12 twice a month 25 each 12   No current facility-administered medications for this visit.     Review of Systems Review of Systems  Constitutional: Negative.   Respiratory: Negative.   Cardiovascular: Negative.     Blood pressure 120/74, pulse 78, resp. rate 12, height 5\' 2"  (1.575 m), weight 133 lb (60.3 kg).  Physical Exam Physical Exam  Constitutional: She is oriented to person, place, and time. She appears well-developed and well-nourished.  HENT:  Mouth/Throat: Oropharynx is clear and moist.  Eyes: Conjunctivae are normal. No scleral icterus.  Neck: Neck supple.  Cardiovascular: Normal rate, regular rhythm and normal heart sounds.   Pulmonary/Chest: Effort normal and breath sounds normal. Right breast exhibits no inverted nipple, no mass, no nipple discharge, no skin change and no tenderness. Left breast exhibits no inverted nipple, no mass, no nipple discharge, no skin change and no tenderness.    Lymphadenopathy:    She has no cervical adenopathy.    She has no axillary adenopathy.  Neurological: She is alert and oriented to person, place, and time.  Skin: Skin is warm and dry.  Psychiatric: Her behavior is normal.    Data Reviewed April 6 18, 2017 mammograms reviewed.Marland Kitchen  Ultrasound examination of the left breast was completed from the 9 to 12:00 position. At the 9:00 position there is a  small 0.3 x 0.3 x 0.44 cm slightly lobulated hypoechoic nodule with date acoustic shadowing, BIRAD-4.  At the 12:00 position, 3 cm from the nipple there is a tall is an wide ill-defined hypoechoic nodule measuring 0.4 x 0.5 x 0.57 cm. Acoustic shadowing is noted. BI-RADS-4.  Focal ultrasound findings corresponding the area of patient concern.    Assessment    Subtle change on clinical breast exam, abnormal ultrasound.    Plan    The patient is taking her mentally disabled sister. to the mountains today for a [place] foilage tour and apple picking, and is amenable to return in tomorrow for a biopsy.     This information has been scribed by Karie Fetch RN, BSN,BC.   Gaspar Cola 03/20/2016, 8:22 AM

## 2016-03-21 ENCOUNTER — Ambulatory Visit (INDEPENDENT_AMBULATORY_CARE_PROVIDER_SITE_OTHER): Payer: PPO | Admitting: General Surgery

## 2016-03-21 ENCOUNTER — Inpatient Hospital Stay: Payer: Self-pay

## 2016-03-21 ENCOUNTER — Encounter: Payer: Self-pay | Admitting: General Surgery

## 2016-03-21 VITALS — BP 126/76 | Ht 63.0 in | Wt 133.0 lb

## 2016-03-21 DIAGNOSIS — N6320 Unspecified lump in the left breast, unspecified quadrant: Secondary | ICD-10-CM

## 2016-03-21 DIAGNOSIS — N6324 Unspecified lump in the left breast, lower inner quadrant: Secondary | ICD-10-CM | POA: Diagnosis not present

## 2016-03-21 DIAGNOSIS — N6012 Diffuse cystic mastopathy of left breast: Secondary | ICD-10-CM | POA: Diagnosis not present

## 2016-03-21 HISTORY — PX: BREAST BIOPSY: SHX20

## 2016-03-21 NOTE — Progress Notes (Signed)
Patient ID: Katie Keith, female   DOB: Oct 08, 1945, 70 y.o.   MRN: [id number]  Chief Complaint  Patient presents with  . Procedure    left breast biopsy    HPI Katie Keith is a 70 y.o. female here today for a left breast biopsy. \ HPI  Past Medical History:  Diagnosis Date  . Breast cancer ([place]) 1992   LT LUMPECTOMY  . Cancer ([place]) 1992   DCIS left breast, treated with wide excision.  . Diverticulosis 2006  . Hypothyroidism   . Migraines   . Peripheral neuropathy ([place])   . Thyroid disease     Past Surgical History:  Procedure Laterality Date  . BREAST SURGERY Left 1992   wide excision for DCIS  . COLONOSCOPY  2006   Dr Bary Castilla  . COLONOSCOPY WITH PROPOFOL N/A 04/18/2015   Procedure: COLONOSCOPY WITH PROPOFOL;  Surgeon: Robert Bellow, MD;  Location: [facility] ENDOSCOPY;  Service: Endoscopy;  Laterality: N/A;  . HERNIA REPAIR    . thumb surgery     injury with infection    Family History  Problem Relation Age of Onset  . Breast cancer Mother     27'S  . Diabetes Mother   . Lung cancer Father   . Cancer Sister 61    breast  . Breast cancer Sister 52  . Breast cancer Paternal Grandmother     77'S    Social History Social History  Substance Use Topics  . Smoking status: Never Smoker  . Smokeless tobacco: Never Used  . Alcohol use No    Allergies  Allergen Reactions  . Penicillins Swelling  . Sulfa Antibiotics Rash    Current Outpatient Prescriptions  Medication Sig Dispense Refill  . aspirin 81 MG tablet Take 81 mg by mouth daily.    . Biotin 5000 MCG CAPS Take by mouth daily.    . Calcium Carbonate-Vitamin D (CALCIUM + D PO) Take by mouth daily.    . carvedilol (COREG) 6.25 MG tablet TAKE ONE TABLET BY MOUTH TWICE DAILY 60 tablet 12  . CINNAMON PO Take by mouth daily.    . cyanocobalamin (,VITAMIN B-12,) 1000 MCG/ML injection Inject twice a month 10 mL 12  . levothyroxine (SYNTHROID, LEVOTHROID) 112 MCG tablet TAKE ONE TABLET BY MOUTH ONCE  DAILY BEFORE BREAKFAST 30 tablet 12  . NEEDLE, DISP, 25 G 25G X 1" MISC Inject B12 twice a month 10 each 12  . OVER THE COUNTER MEDICATION Takes Alphoric acid OTC    . Potassium 75 MG TABS Take by mouth daily.    Marland Kitchen scopolamine (TRANSDERM-SCOP, 1.5 MG,) 1 MG/3DAYS Place 1 patch (1.5 mg total) onto the skin every 3 (three) days. 10 patch 12  . Syringe, Disposable, 3 ML MISC Use to inject B12 twice a month 25 each 12   No current facility-administered medications for this visit.     Review of Systems Review of Systems  Constitutional: Negative.   Respiratory: Negative.   Cardiovascular: Negative.     Blood pressure 126/76, height 5\' 3"  (1.6 m), weight 133 lb (60.3 kg).  Physical Exam Physical Exam  Constitutional: She is oriented to person, place, and time. She appears well-developed and well-nourished.  Pulmonary/Chest:    Neurological: She is alert and oriented to person, place, and time.  Skin: Skin is dry.    Data Reviewed Ultrasound was used to identify the areas previously appreciated to show focal thickening and uncle exam. At the 9:00 position, 5 cm from  the nipple a focal area of hypoechoic tissue with posterior acoustic enhancement was identified. 5 mL of 0.5% Xylocaine with 0.25% Marcaine with 1-200,000 epinephrine was utilized well tolerated. The area was approached obliquely from the 6:00 position with a 14-gauge Finesse biopsy device. Multiple core biopsies were obtained with complete removal of the area. Postbiopsy clip was placed.  The area to 12:00 position showed a focal hypoechoic area with dense acoustic shadowing 3 cm some nipple was identified. 5 mL of 0.5% Xylocaine with 0.25% Marcaine with 1-200,000 epinephrine was utilized well tolerated. A 14-gauge Finesse core biopsy device was utilized. Multiple samples were completed with complete removal of the hypoechoic area. Postbiopsy clip placed.  Biopsy sites were low-dose with a dressing of benzoin, Steri-Strips  followed by Telfa and Tegaderm dressing.  He patient tolerated the procedure well.  Assessment    Focal abnormality on clinical and ultrasound exam.    Plan    The patient was instructed on postbiopsy wound care. Follow up will be by phone when pathology results are available.     This information has been scribed by Gaspar Cola CMA.  Robert Bellow 03/24/2016, 8:30 PM

## 2016-03-21 NOTE — Patient Instructions (Signed)

## 2016-04-01 ENCOUNTER — Ambulatory Visit: Payer: PPO | Admitting: General Surgery

## 2016-05-05 ENCOUNTER — Other Ambulatory Visit: Payer: Self-pay | Admitting: Family Medicine

## 2016-05-20 ENCOUNTER — Ambulatory Visit (INDEPENDENT_AMBULATORY_CARE_PROVIDER_SITE_OTHER): Payer: PPO | Admitting: *Deleted

## 2016-05-20 DIAGNOSIS — D649 Anemia, unspecified: Secondary | ICD-10-CM | POA: Diagnosis not present

## 2016-05-20 LAB — POC HEMOCCULT BLD/STL (HOME/3-CARD/SCREEN)
Card #2 Fecal Occult Blod, POC: NEGATIVE
Card #3 Fecal Occult Blood, POC: NEGATIVE
Fecal Occult Blood, POC: NEGATIVE

## 2016-06-05 ENCOUNTER — Telehealth: Payer: Self-pay

## 2016-06-05 NOTE — Telephone Encounter (Signed)
Pharmacy requesting approval to use new generic manufacturer for levothyroxin 112 mcg tablets. Please review. Thank you. sd

## 2016-06-06 ENCOUNTER — Telehealth: Payer: Self-pay | Admitting: Physician Assistant

## 2016-06-06 NOTE — Telephone Encounter (Signed)
Called [facility] pharmacy. sd

## 2016-06-06 NOTE — Telephone Encounter (Signed)
It is okay to change manufacturers for levothyroxine.

## 2016-07-07 DIAGNOSIS — H353131 Nonexudative age-related macular degeneration, bilateral, early dry stage: Secondary | ICD-10-CM | POA: Diagnosis not present

## 2016-07-07 DIAGNOSIS — H2513 Age-related nuclear cataract, bilateral: Secondary | ICD-10-CM | POA: Diagnosis not present

## 2016-08-05 ENCOUNTER — Ambulatory Visit: Payer: PPO | Admitting: Family Medicine

## 2016-08-11 ENCOUNTER — Ambulatory Visit (INDEPENDENT_AMBULATORY_CARE_PROVIDER_SITE_OTHER): Payer: PPO | Admitting: Family Medicine

## 2016-08-11 VITALS — BP 122/74 | HR 84 | Temp 98.4°F | Resp 14 | Wt 154.0 lb

## 2016-08-11 DIAGNOSIS — Z2821 Immunization not carried out because of patient refusal: Secondary | ICD-10-CM

## 2016-08-11 DIAGNOSIS — R739 Hyperglycemia, unspecified: Secondary | ICD-10-CM | POA: Diagnosis not present

## 2016-08-11 DIAGNOSIS — G5793 Unspecified mononeuropathy of bilateral lower limbs: Secondary | ICD-10-CM | POA: Diagnosis not present

## 2016-08-11 DIAGNOSIS — I10 Essential (primary) hypertension: Secondary | ICD-10-CM | POA: Diagnosis not present

## 2016-08-11 DIAGNOSIS — D508 Other iron deficiency anemias: Secondary | ICD-10-CM | POA: Diagnosis not present

## 2016-08-11 DIAGNOSIS — E538 Deficiency of other specified B group vitamins: Secondary | ICD-10-CM

## 2016-08-11 LAB — POCT GLYCOSYLATED HEMOGLOBIN (HGB A1C): Hemoglobin A1C: 6.1

## 2016-08-11 NOTE — Progress Notes (Signed)
Katie Keith  MRN: [id number] DOB: Mar 04, 1946  Subjective:  HPI  Patient is here for 6 months follow up. TSH, Iron and CBC were checked in August 2017 and were stable. B12, Lipid checked in may 2017. metc in January 2017. Lab Results  Component Value Date   TSH 0.462 02/04/2016   Patient is getting B12 injections at home every 2 weeks and sometimes every 3 weeks and symptoms of neuropathy are stable at this time.  BP Readings from Last 3 Encounters:  08/11/16 122/74  03/21/16 126/76  03/20/16 120/74    Patient Active Problem List   Diagnosis Date Noted  . Left breast mass 03/20/2016  . Hypertension 07/04/2015  . Hypothyroidism 07/04/2015  . Eustachian tube dysfunction 03/16/2015  . Encounter for screening colonoscopy 02/22/2015  . History of breast cancer 02/22/2015  . Vitamin B12 deficiency 02/16/2015  . Generalized abdominal pain 10/04/2014  . Left-sided thoracic back pain 10/04/2014    Past Medical History:  Diagnosis Date  . Breast cancer ([place]) 1992   LT LUMPECTOMY  . Cancer ([place]) 1992   DCIS left breast, treated with wide excision.  . Diverticulosis 2006  . Hypothyroidism   . Migraines   . Peripheral neuropathy ([place])   . Thyroid disease     Social History   Social History  . Marital status: Married    Spouse name: N/A  . Number of children: N/A  . Years of education: N/A   Occupational History  . Not on file.   Social History Main Topics  . Smoking status: Never Smoker  . Smokeless tobacco: Never Used  . Alcohol use No  . Drug use: No  . Sexual activity: Not on file   Other Topics Concern  . Not on file   Social History Narrative  . No narrative on file    Outpatient Encounter Prescriptions as of 08/11/2016  Medication Sig  . aspirin 81 MG tablet Take 81 mg by mouth daily.  . Biotin 5000 MCG CAPS Take by mouth daily.  . Calcium Carbonate-Vitamin D (CALCIUM + D PO) Take by mouth daily.  . carvedilol (COREG) 6.25 MG tablet TAKE ONE  TABLET BY MOUTH TWICE DAILY  . CINNAMON PO Take by mouth daily.  . cyanocobalamin (,VITAMIN B-12,) 1000 MCG/ML injection Inject twice a month  . levothyroxine (SYNTHROID, LEVOTHROID) 112 MCG tablet TAKE ONE TABLET BY MOUTH ONCE DAILY BEFORE BREAKFAST  . NEEDLE, DISP, 25 G 25G X 1" MISC Inject B12 twice a month  . OVER THE COUNTER MEDICATION Takes Alphoric acid OTC  . Syringe, Disposable, 3 ML MISC Use to inject B12 twice a month  . [DISCONTINUED] Potassium 75 MG TABS Take by mouth daily.  . [DISCONTINUED] scopolamine (TRANSDERM-SCOP, 1.5 MG,) 1 MG/3DAYS Place 1 patch (1.5 mg total) onto the skin every 3 (three) days.   No facility-administered encounter medications on file as of 08/11/2016.     Allergies  Allergen Reactions  . Penicillins Swelling  . Sulfa Antibiotics Rash    Review of Systems  Constitutional: Negative.   HENT: Negative.   Eyes: Negative.   Respiratory: Negative.   Cardiovascular: Negative.   Gastrointestinal: Negative.   Musculoskeletal: Positive for joint pain (hip pain at times).  Skin: Negative.   Neurological: Positive for tingling.       Neuropathy of the feet  Endo/Heme/Allergies: Negative.     Objective:  BP 122/74   Pulse 84   Temp 98.4 F (36.9 C)   Resp 14  Wt 154 lb (69.9 kg)   BMI 27.28 kg/m   Physical Exam  Constitutional: She is oriented to person, place, and time and well-developed, well-nourished, and in no distress.  HENT:  Head: Normocephalic and atraumatic.  Right Ear: External ear normal.  Left Ear: External ear normal.  Nose: Nose normal.  Eyes: Conjunctivae are normal. No scleral icterus.  Neck: No thyromegaly present.  Cardiovascular: Normal rate, regular rhythm and normal heart sounds.   Pulmonary/Chest: Effort normal and breath sounds normal.  Abdominal: Soft.  Neurological: She is alert and oriented to person, place, and time. Gait normal. GCS score is 15.  Skin: Skin is warm and dry.  Psychiatric: Mood, memory, affect  and judgment normal.    Assessment and Plan :  1. Other iron deficiency anemia   2. Neuropathy of both feet   3. Vitamin B12 deficiency Continue every 2-3 weeks. Daughter is nurse gives the B12 shot. 4. Essential hypertension   5. Influenza vaccination declined   6. Pneumococcal vaccination declined  7. Hyperglycemia 6.1 today. Stable. Discussed at length working on habits and will re check on the next visit, - POCT HgB A1C 8.Benign Breast Biopsy 2017  HPI, Exam and A&P transcribed under direction and in the presence of Miguel Aschoff, MD. I have done the exam and reviewed the chart and it is accurate to the best of my knowledge. Development worker, community has been used and  any errors in dictation or transcription are unintentional. Miguel Aschoff M.D. [facility]

## 2016-08-12 LAB — CBC WITH DIFFERENTIAL/PLATELET
BASOS ABS: 0 10*3/uL (ref 0.0–0.2)
Basos: 0 %
EOS (ABSOLUTE): 0.4 10*3/uL (ref 0.0–0.4)
Eos: 5 %
Hematocrit: 37.7 % (ref 34.0–46.6)
Hemoglobin: 12.1 g/dL (ref 11.1–15.9)
Immature Grans (Abs): 0 10*3/uL (ref 0.0–0.1)
Immature Granulocytes: 0 %
LYMPHS ABS: 1.5 10*3/uL (ref 0.7–3.1)
Lymphs: 20 %
MCH: 24.6 pg — ABNORMAL LOW (ref 26.6–33.0)
MCHC: 32.1 g/dL (ref 31.5–35.7)
MCV: 77 fL — ABNORMAL LOW (ref 79–97)
MONOCYTES: 12 %
Monocytes Absolute: 0.9 10*3/uL (ref 0.1–0.9)
Neutrophils Absolute: 4.7 10*3/uL (ref 1.4–7.0)
Neutrophils: 63 %
PLATELETS: 318 10*3/uL (ref 150–379)
RBC: 4.92 x10E6/uL (ref 3.77–5.28)
RDW: 16.2 % — AB (ref 12.3–15.4)
WBC: 7.5 10*3/uL (ref 3.4–10.8)

## 2016-08-12 LAB — COMPREHENSIVE METABOLIC PANEL
ALK PHOS: 93 IU/L (ref 39–117)
ALT: 11 IU/L (ref 0–32)
AST: 16 IU/L (ref 0–40)
Albumin/Globulin Ratio: 1.6 (ref 1.2–2.2)
Albumin: 4.1 g/dL (ref 3.5–4.8)
BILIRUBIN TOTAL: 0.3 mg/dL (ref 0.0–1.2)
BUN/Creatinine Ratio: 22 (ref 12–28)
BUN: 16 mg/dL (ref 8–27)
CHLORIDE: 103 mmol/L (ref 96–106)
CO2: 24 mmol/L (ref 18–29)
Calcium: 9.7 mg/dL (ref 8.7–10.3)
Creatinine, Ser: 0.72 mg/dL (ref 0.57–1.00)
GFR calc Af Amer: 97 mL/min/{1.73_m2} (ref >59)
GFR calc non Af Amer: 84 mL/min/{1.73_m2} (ref >59)
GLUCOSE: 92 mg/dL (ref 65–99)
Globulin, Total: 2.6 g/dL (ref 1.5–4.5)
POTASSIUM: 5.1 mmol/L (ref 3.5–5.2)
Sodium: 140 mmol/L (ref 134–144)
TOTAL PROTEIN: 6.7 g/dL (ref 6.0–8.5)

## 2016-08-12 LAB — VITAMIN B12

## 2016-08-12 LAB — TSH: TSH: 0.766 u[IU]/mL (ref 0.450–4.500)

## 2016-08-18 ENCOUNTER — Telehealth: Payer: Self-pay

## 2016-08-18 NOTE — Telephone Encounter (Signed)
-----   Message from Jerrol Banana., MD sent at 08/18/2016  9:17 AM EDT ----- Labs ok/stable.

## 2016-08-18 NOTE — Telephone Encounter (Signed)
Pt advised.   Thanks,   -Katie Keith  

## 2016-09-09 ENCOUNTER — Telehealth: Payer: Self-pay

## 2016-09-09 NOTE — Telephone Encounter (Signed)
Left message to schedule AWV. ANR

## 2016-10-01 ENCOUNTER — Other Ambulatory Visit: Payer: Self-pay | Admitting: Family Medicine

## 2016-10-01 DIAGNOSIS — Z1231 Encounter for screening mammogram for malignant neoplasm of breast: Secondary | ICD-10-CM

## 2016-10-22 ENCOUNTER — Ambulatory Visit
Admission: RE | Admit: 2016-10-22 | Discharge: 2016-10-22 | Disposition: A | Discharge status: Home / Self Care | Payer: PPO | Source: Ambulatory Visit | Attending: Family Medicine | Admitting: Family Medicine

## 2016-10-22 DIAGNOSIS — Z1231 Encounter for screening mammogram for malignant neoplasm of breast: Secondary | ICD-10-CM | POA: Diagnosis not present

## 2016-11-10 ENCOUNTER — Other Ambulatory Visit: Payer: Self-pay | Admitting: Family Medicine

## 2016-11-10 DIAGNOSIS — E538 Deficiency of other specified B group vitamins: Secondary | ICD-10-CM

## 2016-12-29 ENCOUNTER — Other Ambulatory Visit: Payer: Self-pay | Admitting: Family Medicine

## 2017-02-18 ENCOUNTER — Ambulatory Visit (INDEPENDENT_AMBULATORY_CARE_PROVIDER_SITE_OTHER): Payer: PPO

## 2017-02-18 VITALS — BP 126/72 | HR 68 | Temp 99.1°F | Ht 63.0 in | Wt 150.6 lb

## 2017-02-18 DIAGNOSIS — Z23 Encounter for immunization: Secondary | ICD-10-CM | POA: Diagnosis not present

## 2017-02-18 DIAGNOSIS — Z0000 Encounter for general adult medical examination without abnormal findings: Secondary | ICD-10-CM

## 2017-02-18 NOTE — Progress Notes (Signed)
Subjective:   Katie Keith is a 71 y.o. female who presents for Medicare Annual (Subsequent) preventive examination.  Review of Systems:  N/A  Cardiac Risk Factors include: advanced age (>67men, >75 women);dyslipidemia;hypertension     Objective:     Vitals: BP 126/72 (BP Location: Left Arm)   Pulse 68   Temp 99.1 F (37.3 C) (Oral)   Ht 5\' 3"  (1.6 m)   Wt 150 lb 9.6 oz (68.3 kg)   BMI 26.68 kg/m   Body mass index is 26.68 kg/m.   Tobacco History  Smoking Status  . Never Smoker  Smokeless Tobacco  . Never Used     Counseling given: Not Answered   Past Medical History:  Diagnosis Date  . Breast cancer ([place]) 1992   LT LUMPECTOMY  . Cancer ([place]) 1992   DCIS left breast, treated with wide excision.  . Diverticulosis 2006  . Hypothyroidism   . Migraines   . Peripheral neuropathy   . Thyroid disease    Past Surgical History:  Procedure Laterality Date  . BREAST BIOPSY Left 03/21/2016   2 areas done by byrnett fibrocystic changes  . BREAST LUMPECTOMY Left 1993   no chemo no radation  . BREAST SURGERY Left 1992   wide excision for DCIS  . COLONOSCOPY  2006   Dr Bary Castilla  . COLONOSCOPY WITH PROPOFOL N/A 04/18/2015   Procedure: COLONOSCOPY WITH PROPOFOL;  Surgeon: Robert Bellow, MD;  Location: [facility] END[facility]COPY;  Service: Endoscopy;  Laterality: N/A;  . HERNIA REPAIR    . thumb surgery     injury with infection   Family History  Problem Relation Age of Onset  . Breast cancer Mother        61'S  . Diabetes Mother   . Lung cancer Father   . Cancer Sister 6       breast  . Breast cancer Sister 78  . Breast cancer Paternal Grandmother        66'S   History  Sexual Activity  . Sexual activity: Not on file    Outpatient Encounter Prescriptions as of 02/18/2017  Medication Sig  . aspirin 81 MG tablet Take 81 mg by mouth daily.  . Biotin 5000 MCG CAPS Take by mouth daily.  . Calcium Carbonate-Vitamin D (CALCIUM + D PO) Take by mouth daily.  .  carvedilol (COREG) 6.25 MG tablet TAKE ONE TABLET BY MOUTH TWICE DAILY  . CINNAMON PO Take by mouth daily.  . cyanocobalamin (,VITAMIN B-12,) 1000 MCG/ML injection INJECT 1ML TWICE A MONTH  . levothyroxine (SYNTHROID, LEVOTHROID) 112 MCG tablet TAKE ONE TABLET BY MOUTH ONCE DAILY BEFORE BREAKFAST  . NEEDLE, DISP, 25 G 25G X 1" MISC Inject B12 twice a month  . Omega-3 Fatty Acids (FISH OIL) 1000 MG CAPS Take 1,000 mg by mouth daily.  Marland Kitchen OVER THE COUNTER MEDICATION Takes Alphoric acid OTC  . sodium chloride (OCEAN) 0.65 % SOLN nasal spray Place 1 spray into both nostrils as needed for congestion.  . Syringe, Disposable, 3 ML MISC Use to inject B12 twice a month   No facility-administered encounter medications on file as of 02/18/2017.     Activities of Daily Living In your present state of health, do you have any difficulty performing the following activities: 02/18/2017  Hearing? N  Vision? N  Difficulty concentrating or making decisions? N  Walking or climbing stairs? N  Dressing or bathing? N  Doing errands, shopping? N  Preparing Food and eating ?  N  Using the Toilet? N  In the past six months, have you accidently leaked urine? N  Do you have problems with loss of bowel control? N  Managing your Medications? N  Managing your Finances? N  Housekeeping or managing your Housekeeping? N  Some recent data might be hidden    Patient Care Team: Jerrol Banana., MD as PCP - General (Family Medicine) Bary Castilla, Forest Gleason, MD (General Surgery) Birder Robson, MD as Referring Physician (Ophthalmology) Vladimir Crofts, MD as Consulting Physician (Neurology) Carloyn Manner, MD as Referring Physician (Otolaryngology)    Assessment:     Exercise Activities and Dietary recommendations Current Exercise Habits: The patient does not participate in regular exercise at present (active in daily living), Exercise limited by: None identified  Goals    . Increase water intake           Recommend increasing water intake to 6 glasses a day.       Fall Risk Fall Risk  02/18/2017 10/19/2015 02/09/2015  Falls in the past year? No No No   Depression Screen PHQ 2/9 Scores 02/18/2017 02/18/2017 10/19/2015  PHQ - 2 Score 0 0 0  PHQ- 9 Score 3 - -     Cognitive Function     6CIT Screen 02/18/2017  What Year? 0 points  What month? 0 points  What time? 0 points  Count back from 20 0 points  Months in reverse 2 points  Repeat phrase 2 points  Total Score 4    Immunization History  Administered Date(s) Administered  . Influenza, High Dose Seasonal PF 02/18/2017  . Tdap 05/19/2007  . Zoster 07/19/2008   Screening Tests Health Maintenance  Topic Date Due  . Hepatitis C Screening  23-Mar-1946  . PNA vac Low Risk Adult (1 of 2 - PCV13) 01/13/2009  . TETANUS/TDAP  05/18/2017  . MAMMOGRAM  10/23/2018  . COLONOSCOPY  04/17/2025  . INFLUENZA VACCINE  Completed  . DEXA SCAN  Completed      Plan:  I have personally reviewed and addressed the Medicare Annual Wellness questionnaire and have noted the following in the patient's chart:  A. Medical and social history B. Use of alcohol, tobacco or illicit drugs  C. Current medications and supplements D. Functional ability and status E.  Nutritional status F.  Physical activity G. Advance directives H. List of other physicians I.  Hospitalizations, surgeries, and ER visits in previous 12 months J.  [place] such as hearing and vision if needed, cognitive and depression L. Referrals and appointments - none  In addition, I have reviewed and discussed with patient certain preventive protocols, quality metrics, and best practice recommendations. A written personalized care plan for preventive services as well as general preventive health recommendations were provided to patient.  See attached scanned questionnaire for additional information.   Signed,  Fabio Neighbors, LPN Nurse Health Advisor   MD  Recommendations: Pt declined pneumonia vaccine and Hepatitis C screening today. Pt to receive these at next OV on 03/10/17.

## 2017-02-18 NOTE — Patient Instructions (Signed)
[name] , Thank you for taking time to come for your Medicare Wellness Visit. I appreciate your ongoing commitment to your health goals. Please review the following plan we discussed and let me know if I can assist you in the future.   Screening recommendations/referrals: Colonoscopy: up to date Mammogram: up to date Bone Density: up to date Recommended yearly ophthalmology/optometry visit for glaucoma screening and checkup Recommended yearly dental visit for hygiene and checkup  Vaccinations: Influenza vaccine: completed today Pneumococcal vaccine: declined Tdap vaccine: up to date, due 05/2017 Shingles vaccine: completed 07/19/08  Advanced directives: Please bring a copy of your POA (Power of [place]) and/or Living Will to your next appointment.   Conditions/risks identified: Recommend increasing water intake to 6 glasses a day.   Next appointment: 03/10/17 @ 8:30 AM   Preventive Care 65 Years and Older, Female Preventive care refers to lifestyle choices and visits with your health care provider that can promote health and wellness. What does preventive care include?  A yearly physical exam. This is also called an annual well check.  Dental exams once or twice a year.  Routine eye exams. Ask your health care provider how often you should have your eyes checked.  Personal lifestyle choices, including:  Daily care of your teeth and gums.  Regular physical activity.  Eating a healthy diet.  Avoiding tobacco and drug use.  Limiting alcohol use.  Practicing safe sex.  Taking low-dose aspirin every day.  Taking vitamin and mineral supplements as recommended by your health care provider. What happens during an annual well check? The services and screenings done by your health care provider during your annual well check will depend on your age, overall health, lifestyle risk factors, and family history of disease. Counseling  Your health care provider may ask you  questions about your:  Alcohol use.  Tobacco use.  Drug use.  Emotional well-being.  Home and relationship well-being.  Sexual activity.  Eating habits.  History of falls.  Memory and ability to understand (cognition).  Work and work Statistician.  Reproductive health. Screening  You may have the following tests or measurements:  Height, weight, and BMI.  Blood pressure.  Lipid and cholesterol levels. These may be checked every 5 years, or more frequently if you are over 47 years old.  Skin check.  Lung cancer screening. You may have this screening every year starting at age 27 if you have a 30-pack-year history of smoking and currently smoke or have quit within the past 15 years.  Fecal occult blood test (FOBT) of the stool. You may have this test every year starting at age 43.  Flexible sigmoidoscopy or colonoscopy. You may have a sigmoidoscopy every 5 years or a colonoscopy every 10 years starting at age 60.  Hepatitis C blood test.  Hepatitis B blood test.  Sexually transmitted disease (STD) testing.  Diabetes screening. This is done by checking your blood sugar (glucose) after you have not eaten for a while (fasting). You may have this done every 1-3 years.  Bone density scan. This is done to screen for osteoporosis. You may have this done starting at age 53.  Mammogram. This may be done every 1-2 years. Talk to your health care provider about how often you should have regular mammograms. Talk with your health care provider about your test results, treatment options, and if necessary, the need for more tests. Vaccines  Your health care provider may recommend certain vaccines, such as:  Influenza vaccine. This  is recommended every year.  Tetanus, diphtheria, and acellular pertussis (Tdap, Td) vaccine. You may need a Td booster every 10 years.  Zoster vaccine. You may need this after age 43.  Pneumococcal 13-valent conjugate (PCV13) vaccine. One dose is  recommended after age 69.  Pneumococcal polysaccharide (PPSV23) vaccine. One dose is recommended after age 58. Talk to your health care provider about which screenings and vaccines you need and how often you need them. This information is not intended to replace advice given to you by your health care provider. Make sure you discuss any questions you have with your health care provider. Document Released: 06/22/2015 Document Revised: 02/13/2016 Document Reviewed: 03/27/2015 Elsevier Interactive Patient Education  2017 [place] Prevention in the Home Falls can cause injuries. They can happen to people of all ages. There are many things you can do to make your home safe and to help prevent falls. What can I do on the outside of my home?  Regularly fix the edges of walkways and driveways and fix any cracks.  Remove anything that might make you trip as you walk through a door, such as a raised step or threshold.  Trim any bushes or trees on the path to your home.  Use bright outdoor lighting.  Clear any walking paths of anything that might make someone trip, such as rocks or tools.  Regularly check to see if handrails are loose or broken. Make sure that both sides of any steps have handrails.  Any raised decks and porches should have guardrails on the edges.  Have any leaves, snow, or ice cleared regularly.  Use sand or salt on walking paths during winter.  Clean up any spills in your garage right away. This includes oil or grease spills. What can I do in the bathroom?  Use night lights.  Install grab bars by the toilet and in the tub and shower. Do not use towel bars as grab bars.  Use non-skid mats or decals in the tub or shower.  If you need to sit down in the shower, use a plastic, non-slip stool.  Keep the floor dry. Clean up any water that spills on the floor as soon as it happens.  Remove soap buildup in the tub or shower regularly.  Attach bath mats  securely with double-sided non-slip rug tape.  Do not have throw rugs and other things on the floor that can make you trip. What can I do in the bedroom?  Use night lights.  Make sure that you have a light by your bed that is easy to reach.  Do not use any sheets or blankets that are too big for your bed. They should not hang down onto the floor.  Have a firm chair that has side arms. You can use this for support while you get dressed.  Do not have throw rugs and other things on the floor that can make you trip. What can I do in the kitchen?  Clean up any spills right away.  Avoid walking on wet floors.  Keep items that you use a lot in easy-to-reach places.  If you need to reach something above you, use a strong step stool that has a grab bar.  Keep electrical cords out of the way.  Do not use floor polish or wax that makes floors slippery. If you must use wax, use non-skid floor wax.  Do not have throw rugs and other things on the floor that can make you  trip. What can I do with my stairs?  Do not leave any items on the stairs.  Make sure that there are handrails on both sides of the stairs and use them. Fix handrails that are broken or loose. Make sure that handrails are as long as the stairways.  Check any carpeting to make sure that it is firmly attached to the stairs. Fix any carpet that is loose or worn.  Avoid having throw rugs at the top or bottom of the stairs. If you do have throw rugs, attach them to the floor with carpet tape.  Make sure that you have a light switch at the top of the stairs and the bottom of the stairs. If you do not have them, ask someone to add them for you. What else can I do to help prevent falls?  Wear shoes that:  Do not have high heels.  Have rubber bottoms.  Are comfortable and fit you well.  Are closed at the toe. Do not wear sandals.  If you use a stepladder:  Make sure that it is fully opened. Do not climb a closed  stepladder.  Make sure that both sides of the stepladder are locked into place.  Ask someone to hold it for you, if possible.  Clearly mark and make sure that you can see:  Any grab bars or handrails.  First and last steps.  Where the edge of each step is.  Use tools that help you move around (mobility aids) if they are needed. These include:  Canes.  Walkers.  Scooters.  Crutches.  Turn on the lights when you go into a dark area. Replace any light bulbs as soon as they burn out.  Set up your furniture so you have a clear path. Avoid moving your furniture around.  If any of your floors are uneven, fix them.  If there are any pets around you, be aware of where they are.  Review your medicines with your doctor. Some medicines can make you feel dizzy. This can increase your chance of falling. Ask your doctor what other things that you can do to help prevent falls. This information is not intended to replace advice given to you by your health care provider. Make sure you discuss any questions you have with your health care provider. Document Released: 03/22/2009 Document Revised: 11/01/2015 Document Reviewed: 06/30/2014 Elsevier Interactive Patient Education  2017 [employer].

## 2017-03-09 ENCOUNTER — Ambulatory Visit: Payer: PPO | Admitting: Family Medicine

## 2017-03-10 ENCOUNTER — Ambulatory Visit (INDEPENDENT_AMBULATORY_CARE_PROVIDER_SITE_OTHER): Payer: PPO | Admitting: Family Medicine

## 2017-03-10 ENCOUNTER — Encounter: Payer: Self-pay | Admitting: Family Medicine

## 2017-03-10 VITALS — BP 144/82 | HR 80 | Temp 98.2°F | Resp 14 | Wt 150.0 lb

## 2017-03-10 DIAGNOSIS — E038 Other specified hypothyroidism: Secondary | ICD-10-CM | POA: Diagnosis not present

## 2017-03-10 DIAGNOSIS — Z1159 Encounter for screening for other viral diseases: Secondary | ICD-10-CM

## 2017-03-10 DIAGNOSIS — R739 Hyperglycemia, unspecified: Secondary | ICD-10-CM

## 2017-03-10 DIAGNOSIS — R0789 Other chest pain: Secondary | ICD-10-CM

## 2017-03-10 DIAGNOSIS — I10 Essential (primary) hypertension: Secondary | ICD-10-CM | POA: Diagnosis not present

## 2017-03-10 DIAGNOSIS — Z1211 Encounter for screening for malignant neoplasm of colon: Secondary | ICD-10-CM | POA: Diagnosis not present

## 2017-03-10 DIAGNOSIS — Z0000 Encounter for general adult medical examination without abnormal findings: Secondary | ICD-10-CM | POA: Diagnosis not present

## 2017-03-10 DIAGNOSIS — D508 Other iron deficiency anemias: Secondary | ICD-10-CM | POA: Diagnosis not present

## 2017-03-10 DIAGNOSIS — Z2821 Immunization not carried out because of patient refusal: Secondary | ICD-10-CM | POA: Diagnosis not present

## 2017-03-10 DIAGNOSIS — Z124 Encounter for screening for malignant neoplasm of cervix: Secondary | ICD-10-CM | POA: Diagnosis not present

## 2017-03-10 DIAGNOSIS — S22000A Wedge compression fracture of unspecified thoracic vertebra, initial encounter for closed fracture: Secondary | ICD-10-CM | POA: Insufficient documentation

## 2017-03-10 DIAGNOSIS — E538 Deficiency of other specified B group vitamins: Secondary | ICD-10-CM

## 2017-03-10 LAB — IFOBT (OCCULT BLOOD): IMMUNOLOGICAL FECAL OCCULT BLOOD TEST: NEGATIVE

## 2017-03-10 NOTE — Progress Notes (Signed)
Patient: JOBY HERSHKOWITZ, Female    DOB: June 05, 1946, 71 y.o.   MRN: [id number] Visit Date: 03/10/2017  Today's Provider: Wilhemena Durie, MD   Chief Complaint  Patient presents with  . Annual Exam   Subjective:   ANI DEOLIVEIRA is a 71 y.o. female who presents today for her  Physical exam. Patient saw [facility] for Wellness Visit on 02/18/17. Patient is struggling right now due to her husband recently was diagnosed with cancer-in lymph nodes, lung and bones. He is on chemo right now. Immunization History  Administered Date(s) Administered  . Influenza, High Dose Seasonal PF 02/18/2017  . Tdap 05/19/2007  . Zoster 07/19/2008   Last Colonoscopy was 04/18/15 Dr Bary Castilla, diverticulosis, tubular adenoma, repeat in 2021. Pap smear 10/03/14 negative-never had abnormal pap smears, never had hysterectomy. Mammogram 10/22/16 negative BMD 09/03/11 femoral neck and spine indicated osteopenia, total femoral density was normal.  Review of Systems  Constitutional: Negative.   HENT: Negative.   Eyes: Negative.   Respiratory: Positive for shortness of breath.   Cardiovascular: Negative.   Gastrointestinal: Negative.   Endocrine: Negative.   Genitourinary: Negative.   Musculoskeletal: Negative.   Skin: Negative.   Allergic/Immunologic: Negative.   Neurological: Negative.   Hematological: Negative.   Psychiatric/Behavioral: The patient is nervous/anxious.     Patient Active Problem List   Diagnosis Date Noted  . Compression fracture of thoracic vertebra ([place]) 03/10/2017  . Left breast mass 03/20/2016  . Hypertension 07/04/2015  . Hypothyroidism 07/04/2015  . Eustachian tube dysfunction 03/16/2015  . Encounter for screening colonoscopy 02/22/2015  . History of breast cancer 02/22/2015  . Vitamin B12 deficiency 02/16/2015  . Generalized abdominal pain 10/04/2014  . Left-sided thoracic back pain 10/04/2014    Social History   Social History  . Marital status: Married    Spouse name:  N/A  . Number of children: N/A  . Years of education: N/A   Occupational History  . Not on file.   Social History Main Topics  . Smoking status: Never Smoker  . Smokeless tobacco: Never Used  . Alcohol use No  . Drug use: No  . Sexual activity: Not on file   Other Topics Concern  . Not on file   Social History Narrative  . No narrative on file    Past Surgical History:  Procedure Laterality Date  . BREAST BIOPSY Left 03/21/2016   2 areas done by byrnett fibrocystic changes  . BREAST LUMPECTOMY Left 1993   no chemo no radation  . BREAST SURGERY Left 1992   wide excision for DCIS  . COLONOSCOPY  2006   Dr Bary Castilla  . COLONOSCOPY WITH PROPOFOL N/A 04/18/2015   Procedure: COLONOSCOPY WITH PROPOFOL;  Surgeon: Robert Bellow, MD;  Location: [facility] END[facility]COPY;  Service: Endoscopy;  Laterality: N/A;  . HERNIA REPAIR    . thumb surgery     injury with infection    Her family history includes Breast cancer in her mother and paternal grandmother; Breast cancer (age of onset: 36) in her sister; Cancer (age of onset: 57) in her sister; Diabetes in her mother; Lung cancer in her father.     Outpatient Encounter Prescriptions as of 03/10/2017  Medication Sig  . aspirin 81 MG tablet Take 81 mg by mouth daily.  . Biotin 5000 MCG CAPS Take by mouth daily.  . Calcium Carbonate-Vitamin D (CALCIUM + D PO) Take by mouth daily.  . carvedilol (COREG) 6.25 MG tablet TAKE ONE TABLET BY MOUTH TWICE  DAILY  . CINNAMON PO Take by mouth daily.  . cyanocobalamin (,VITAMIN B-12,) 1000 MCG/ML injection INJECT 1ML TWICE A MONTH  . levothyroxine (SYNTHROID, LEVOTHROID) 112 MCG tablet TAKE ONE TABLET BY MOUTH ONCE DAILY BEFORE BREAKFAST  . NEEDLE, DISP, 25 G 25G X 1" MISC Inject B12 twice a month  . Omega-3 Fatty Acids (FISH OIL) 1000 MG CAPS Take 1,000 mg by mouth daily.  Marland Kitchen OVER THE COUNTER MEDICATION Takes Alphoric acid OTC  . sodium chloride (OCEAN) 0.65 % SOLN nasal spray Place 1 spray into both  nostrils as needed for congestion.  . Syringe, Disposable, 3 ML MISC Use to inject B12 twice a month   No facility-administered encounter medications on file as of 03/10/2017.     Allergies  Allergen Reactions  . Penicillins Swelling  . Sulfa Antibiotics Rash    Patient Care Team: Jerrol Banana., MD as PCP - General (Family Medicine) Bary Castilla, Forest Gleason, MD (General Surgery) Vladimir Crofts, MD as Consulting Physician (Neurology) Carloyn Manner, MD as Referring Physician (Otolaryngology)   Objective:   Vitals:  Vitals:   03/10/17 0849 03/10/17 0946  BP: (!) 164/82 (!) 144/82  Pulse: 80   Resp: 14   Temp: 98.2 F (36.8 C)   Weight: 150 lb (68 kg)     Physical Exam  Constitutional: She is oriented to person, place, and time. She appears well-developed and well-nourished.  HENT:  Head: Normocephalic and atraumatic.  Right Ear: External ear normal.  Left Ear: External ear normal.  Nose: Nose normal.  Mouth/Throat: Oropharynx is clear and moist.  Eyes: Pupils are equal, round, and reactive to light. Conjunctivae are normal. No scleral icterus.  Neck: Normal range of motion. Neck supple. No thyromegaly present.  Cardiovascular: Normal rate, regular rhythm, normal heart sounds and intact distal pulses.  Exam reveals no gallop.   No murmur heard. Pulmonary/Chest: Effort normal and breath sounds normal. No respiratory distress. She has no wheezes. Right breast exhibits no inverted nipple and no mass. Left breast exhibits no inverted nipple and no mass.  Abdominal: Soft. She exhibits no distension. There is no tenderness. There is no rebound.  Genitourinary: Vagina normal and uterus normal. Rectal exam shows external hemorrhoid. Rectal exam shows guaiac negative stool. No vaginal discharge found.  Genitourinary Comments: Cystocele present  Musculoskeletal: Normal range of motion. She exhibits no edema or tenderness.  Neurological: She is alert and oriented to person,  place, and time. No cranial nerve deficit.  Skin: Skin is warm and dry. No rash noted.  Psychiatric: She has a normal mood and affect. Her behavior is normal. Judgment and thought content normal.    Activities of Daily Living In your present state of health, do you have any difficulty performing the following activities: 02/18/2017  Hearing? N  Vision? N  Difficulty concentrating or making decisions? N  Walking or climbing stairs? N  Dressing or bathing? N  Doing errands, shopping? N  Preparing Food and eating ? N  Using the Toilet? N  In the past six months, have you accidently leaked urine? N  Do you have problems with loss of bowel control? N  Managing your Medications? N  Managing your Finances? N  Housekeeping or managing your Housekeeping? N  Some recent data might be hidden    Fall Risk Assessment Fall Risk  02/18/2017 10/19/2015 02/09/2015  Falls in the past year? No No No     Depression Screen PHQ 2/9 Scores 02/18/2017 02/18/2017 10/19/2015  PHQ -  2 Score 0 0 0  PHQ- 9 Score 3 - -   6CIT Screen 02/18/2017  What Year? 0 points  What month? 0 points  What time? 0 points  Count back from 20 0 points  Months in reverse 2 points  Repeat phrase 2 points  Total Score 4    Assessment & Plan:     Annual Wellness Visit  Reviewed patient's Family Medical History Reviewed and updated list of patient's medical providers Assessment of cognitive impairment was done Assessed patient's functional ability Established a written schedule for health screening [place] Completed and Reviewed  Exercise Activities and Dietary recommendations Goals    . Increase water intake          Recommend increasing water intake to 6 glasses a day.       1. Annual physical exam - Lipid Profile  2. Other iron deficiency anemia - B12  3. Vitamin B12 deficiency - B12  4. Hyperglycemia - HgB A1c  5. Essential hypertension Follow up in 6 months. Patient is  struggling with her husbands health. Re check on next visit. 6. Other specified hypothyroidism  7. Pap smear for cervical cancer screening - Pap IG (Image Guided)  8. Colon cancer screening - IFOBT POC (occult bld, rslt in office); Future - IFOBT POC (occult bld, rslt in office)  9. Chest tightness Stable. Patient is most likely having theses symptoms from anxiety about her husbands health. - EKG 12-Lead  10. Pneumococcal vaccination declined 11. Need for hepatitis C screening test - Hepatitis C Antibody  HPI, Exam and A&P transcribed by Tiffany Kocher, RMA under direction and in the presence of Miguel Aschoff, MD. I have done the exam and reviewed the chart and it is accurate to the best of my knowledge. Development worker, community has been used and  any errors in dictation or transcription are unintentional. Miguel Aschoff M.D. [facility]

## 2017-03-11 LAB — PAP IG (IMAGE GUIDED)

## 2017-03-12 ENCOUNTER — Telehealth: Payer: Self-pay

## 2017-03-12 NOTE — Telephone Encounter (Signed)
-----   Message from Jerrol Banana., MD sent at 03/11/2017  2:29 PM EDT ----- Pap normal/

## 2017-03-12 NOTE — Telephone Encounter (Signed)
Patient advised.

## 2017-03-13 DIAGNOSIS — E538 Deficiency of other specified B group vitamins: Secondary | ICD-10-CM | POA: Diagnosis not present

## 2017-03-13 DIAGNOSIS — D508 Other iron deficiency anemias: Secondary | ICD-10-CM | POA: Diagnosis not present

## 2017-03-13 DIAGNOSIS — Z0000 Encounter for general adult medical examination without abnormal findings: Secondary | ICD-10-CM | POA: Diagnosis not present

## 2017-03-13 DIAGNOSIS — R739 Hyperglycemia, unspecified: Secondary | ICD-10-CM | POA: Diagnosis not present

## 2017-03-13 DIAGNOSIS — Z1159 Encounter for screening for other viral diseases: Secondary | ICD-10-CM | POA: Diagnosis not present

## 2017-03-14 LAB — HEMOGLOBIN A1C
Hgb A1c MFr Bld: 5.8 % of total Hgb — ABNORMAL HIGH (ref <5.7)
Mean Plasma Glucose: 120 (calc)
eAG (mmol/L): 6.6 (calc)

## 2017-03-14 LAB — LIPID PANEL
CHOL/HDL RATIO: 5.6 (calc) — AB (ref <5.0)
Cholesterol: 180 mg/dL (ref <200)
HDL: 32 mg/dL — AB (ref >50)
LDL CHOLESTEROL (CALC): 116 mg/dL — AB
NON-HDL CHOLESTEROL (CALC): 148 mg/dL — AB (ref <130)
TRIGLYCERIDES: 198 mg/dL — AB (ref <150)

## 2017-03-14 LAB — VITAMIN B12: VITAMIN B 12: 1070 pg/mL (ref 200–1100)

## 2017-03-14 LAB — HEPATITIS C ANTIBODY
HEP C AB: NONREACTIVE
SIGNAL TO CUT-OFF: 0.01 (ref <1.00)

## 2017-05-16 ENCOUNTER — Other Ambulatory Visit: Payer: Self-pay | Admitting: Family Medicine

## 2017-06-15 DIAGNOSIS — H2513 Age-related nuclear cataract, bilateral: Secondary | ICD-10-CM | POA: Diagnosis not present

## 2017-07-06 ENCOUNTER — Other Ambulatory Visit: Payer: Self-pay | Admitting: Family Medicine

## 2017-07-06 DIAGNOSIS — Z1231 Encounter for screening mammogram for malignant neoplasm of breast: Secondary | ICD-10-CM

## 2017-07-11 ENCOUNTER — Ambulatory Visit (INDEPENDENT_AMBULATORY_CARE_PROVIDER_SITE_OTHER): Payer: PPO | Admitting: Family Medicine

## 2017-07-11 ENCOUNTER — Encounter: Payer: Self-pay | Admitting: Family Medicine

## 2017-07-11 VITALS — BP 108/60 | HR 104 | Temp 98.5°F | Resp 16 | Wt 152.0 lb

## 2017-07-11 DIAGNOSIS — R079 Chest pain, unspecified: Secondary | ICD-10-CM

## 2017-07-11 DIAGNOSIS — R1013 Epigastric pain: Secondary | ICD-10-CM

## 2017-07-11 MED ORDER — HYOSCYAMINE SULFATE SL 0.125 MG SL SUBL: 1.0000 | SUBLINGUAL_TABLET | Freq: Three times a day (TID) | SUBLINGUAL | 0 refills | Status: DC | PRN

## 2017-07-11 NOTE — Progress Notes (Signed)
Patient: Katie Keith Female    DOB: 05-Aug-1945   72 y.o.   MRN: [id number] Visit Date: 07/11/2017  Today's Provider: Vernie Murders, PA   Chief Complaint  Patient presents with  . Abdominal Pain   Subjective:    HPI Pt is her today for abdominal pain. She reports that it started 3 days ago in the afternoon. She started having epigastric pain and felt like reflux or indigestion. Then she pain started radiating into her chest and she got concerned. She reports that her abdominal area hurts when she pushes on certain area. But the pain over all is worse when she is up doing things and moving around. She has not eaten much since this started. She has taken Tums and Prilosec that may have helped a little but the pain is still there. She also took 2 tylenol this morning. Denies any shortness of breath or sweats, dizziness, headache, arm pain, nausea or vomiting.   Past Medical History:  Diagnosis Date  . Breast cancer ([place]) 1992   LT LUMPECTOMY  . Cancer ([place]) 1992   DCIS left breast, treated with wide excision.  . Diverticulosis 2006  . Hypothyroidism   . Migraines   . Peripheral neuropathy   . Thyroid disease    Patient Active Problem List   Diagnosis Date Noted  . Compression fracture of thoracic vertebra ([place]) 03/10/2017  . Left breast mass 03/20/2016  . Hypertension 07/04/2015  . Hypothyroidism 07/04/2015  . Eustachian tube dysfunction 03/16/2015  . Encounter for screening colonoscopy 02/22/2015  . History of breast cancer 02/22/2015  . Vitamin B12 deficiency 02/16/2015  . Generalized abdominal pain 10/04/2014  . Left-sided thoracic back pain 10/04/2014   Past Surgical History:  Procedure Laterality Date  . BREAST BIOPSY Left 03/21/2016   2 areas done by byrnett fibrocystic changes  . BREAST LUMPECTOMY Left 1993   no chemo no radation  . BREAST SURGERY Left 1992   wide excision for DCIS  . COLONOSCOPY  2006   Dr Bary Castilla  . COLONOSCOPY WITH PROPOFOL N/A  04/18/2015   Procedure: COLONOSCOPY WITH PROPOFOL;  Surgeon: Robert Bellow, MD;  Location: [facility] ENDOSCOPY;  Service: Endoscopy;  Laterality: N/A;  . HERNIA REPAIR    . thumb surgery     injury with infection   Family History  Problem Relation Age of Onset  . Breast cancer Mother        34'S  . Diabetes Mother   . Lung cancer Father   . Cancer Sister 63       breast  . Breast cancer Sister 29  . Breast cancer Paternal Grandmother        43'S   Allergies  Allergen Reactions  . Penicillins Swelling  . Sulfa Antibiotics Rash    Current Outpatient Medications:  .  aspirin 81 MG tablet, Take 81 mg by mouth daily., Disp: , Rfl:  .  Biotin 5000 MCG CAPS, Take by mouth daily., Disp: , Rfl:  .  Calcium Carbonate-Vitamin D (CALCIUM + D PO), Take by mouth daily., Disp: , Rfl:  .  carvedilol (COREG) 6.25 MG tablet, TAKE ONE TABLET BY MOUTH TWICE DAILY, Disp: 60 tablet, Rfl: 12 .  CINNAMON PO, Take by mouth daily., Disp: , Rfl:  .  cyanocobalamin (,VITAMIN B-12,) 1000 MCG/ML injection, INJECT 1ML TWICE A MONTH, Disp: 6 mL, Rfl: 3 .  levothyroxine (SYNTHROID, LEVOTHROID) 112 MCG tablet, TAKE ONE TABLET BY MOUTH ONCE DAILY BEFORE BREAKFAST,  Disp: 30 tablet, Rfl: 12 .  NEEDLE, DISP, 25 G 25G X 1" MISC, Inject B12 twice a month, Disp: 10 each, Rfl: 12 .  Omega-3 Fatty Acids (FISH OIL) 1000 MG CAPS, Take 1,000 mg by mouth daily., Disp: , Rfl:  .  OVER THE COUNTER MEDICATION, Takes Alphoric acid OTC, Disp: , Rfl:  .  sodium chloride (OCEAN) 0.65 % SOLN nasal spray, Place 1 spray into both nostrils as needed for congestion., Disp: , Rfl:  .  Syringe, Disposable, 3 ML MISC, Use to inject B12 twice a month, Disp: 25 each, Rfl: 12  Review of Systems  Constitutional: Negative.   HENT: Negative.   Eyes: Negative.   Cardiovascular: Positive for chest pain.  Gastrointestinal: Positive for abdominal pain.  Endocrine: Negative.   Genitourinary: Negative.   Musculoskeletal: Negative.   Skin:  Negative.   Allergic/Immunologic: Negative.   Neurological: Negative.   Hematological: Negative.   Psychiatric/Behavioral: Negative.     Social History   Tobacco Use  . Smoking status: Never Smoker  . Smokeless tobacco: Never Used  Substance Use Topics  . Alcohol use: No   Objective:   BP 108/60 (BP Location: Right Arm, Patient Position: Sitting, Cuff Size: Normal)   Pulse (!) 104   Temp 98.5 F (36.9 C) (Oral)   Resp 16   Wt 152 lb (68.9 kg)   SpO2 97%   BMI 26.93 kg/m  Vitals:   07/11/17 0913  BP: 108/60  Pulse: (!) 104  Resp: 16  Temp: 98.5 F (36.9 C)  TempSrc: Oral  SpO2: 97%  Weight: 152 lb (68.9 kg)     Physical Exam  Constitutional: She is oriented to person, place, and time. She appears well-developed and well-nourished. No distress.  HENT:  Head: Normocephalic and atraumatic.  Right Ear: Hearing and external ear normal.  Left Ear: Hearing and external ear normal.  Nose: Nose normal.  Mouth/Throat: Oropharynx is clear and moist.  Eyes: Conjunctivae and lids are normal. Right eye exhibits no discharge. Left eye exhibits no discharge. No scleral icterus.  Neck: Neck supple.  Cardiovascular: Normal rate and regular rhythm.  Pulmonary/Chest: Effort normal and breath sounds normal. No respiratory distress.  Abdominal: Soft. She exhibits no mass. There is tenderness. There is no rebound and no guarding.  Quiet bowel sounds. Left epigastric tenderness to deep palpation. No tympany to percussion.  Musculoskeletal: Normal range of motion.  Lymphadenopathy:    She has no cervical adenopathy.  Neurological: She is alert and oriented to person, place, and time.  Skin: Skin is intact. No lesion and no rash noted.  Psychiatric: She has a normal mood and affect. Her speech is normal and behavior is normal. Thought content normal.      Assessment & Plan:     1. Chest pain, unspecified type Onset with indigestion in the upper abdomen and burping. No breathing  difficulties, diaphoresis or dizziness. No palpitations. EKG normal sinus rhythm - EKG 12-Lead  2. Epigastric pain Onset after eating some homemade potato soup Thursday afternoon. No nausea, vomiting, hematemesis, melena, diarrhea or constipation. Soreness in epigastrium and more noticeable when sitting up from a supine position. Discomfort feels like it radiates into the mid chest occasionally. Not related to walking and may occur when lying down. Described as a sore pain. Suspect GERD with esophageal spasm. May continue Prilosec and prn Tums (has started to help symptoms after use yesterday. Will give Levsin-SL to use if more spasm occur or persist. Recommend bland diet,  restrict caffeine and acidic foods. Recheck if no better in 2-3 days. - Hyoscyamine Sulfate SL (LEVSIN/SL) 0.125 MG SUBL; Place 1 tablet under the tongue 3 (three) times daily as needed.  Dispense: 12 each; Refill: [place], [place]  [facility]

## 2017-07-21 DIAGNOSIS — H2512 Age-related nuclear cataract, left eye: Secondary | ICD-10-CM | POA: Diagnosis not present

## 2017-07-28 ENCOUNTER — Encounter: Payer: Self-pay | Admitting: *Deleted

## 2017-08-04 ENCOUNTER — Ambulatory Visit
Admission: RE | Admit: 2017-08-04 | Discharge: 2017-08-04 | Disposition: A | Discharge status: Home / Self Care | Payer: PPO | Source: Ambulatory Visit | Attending: Ophthalmology | Admitting: Ophthalmology

## 2017-08-04 ENCOUNTER — Ambulatory Visit: Payer: PPO | Admitting: Anesthesiology

## 2017-08-04 ENCOUNTER — Encounter
Admission: RE | Disposition: A | Discharge status: Home / Self Care | Payer: Self-pay | Source: Ambulatory Visit | Attending: Ophthalmology

## 2017-08-04 DIAGNOSIS — E039 Hypothyroidism, unspecified: Secondary | ICD-10-CM | POA: Insufficient documentation

## 2017-08-04 DIAGNOSIS — H2512 Age-related nuclear cataract, left eye: Secondary | ICD-10-CM | POA: Diagnosis not present

## 2017-08-04 DIAGNOSIS — Z7982 Long term (current) use of aspirin: Secondary | ICD-10-CM | POA: Diagnosis not present

## 2017-08-04 DIAGNOSIS — Z79899 Other long term (current) drug therapy: Secondary | ICD-10-CM | POA: Insufficient documentation

## 2017-08-04 DIAGNOSIS — K219 Gastro-esophageal reflux disease without esophagitis: Secondary | ICD-10-CM | POA: Insufficient documentation

## 2017-08-04 DIAGNOSIS — Z853 Personal history of malignant neoplasm of breast: Secondary | ICD-10-CM | POA: Diagnosis not present

## 2017-08-04 DIAGNOSIS — I10 Essential (primary) hypertension: Secondary | ICD-10-CM | POA: Diagnosis not present

## 2017-08-04 HISTORY — DX: Adverse effect of unspecified anesthetic, initial encounter: T41.45XA

## 2017-08-04 HISTORY — DX: Other specified postprocedural states: Z98.890

## 2017-08-04 HISTORY — DX: Other complications of anesthesia, initial encounter: T88.59XA

## 2017-08-04 HISTORY — DX: Essential (primary) hypertension: I10

## 2017-08-04 HISTORY — PX: CATARACT EXTRACTION W/PHACO: SHX586

## 2017-08-04 HISTORY — DX: Nausea with vomiting, unspecified: R11.2

## 2017-08-04 SURGERY — PHACOEMULSIFICATION, CATARACT, WITH IOL INSERTION
Anesthesia: Monitor Anesthesia Care | Site: Eye | Laterality: Left | Wound class: Clean

## 2017-08-04 MED ORDER — POVIDONE-IODINE 5 % OP SOLN
OPHTHALMIC | Status: AC
  Filled 2017-08-04: qty 30

## 2017-08-04 MED ORDER — MIDAZOLAM HCL 2 MG/2ML IJ SOLN
INTRAMUSCULAR | Status: DC | PRN
  Administered 2017-08-04 (×2): 1 mg via INTRAVENOUS

## 2017-08-04 MED ORDER — BSS IO SOLN
INTRAOCULAR | Status: DC | PRN
  Administered 2017-08-04: 4 mL via OPHTHALMIC

## 2017-08-04 MED ORDER — ARMC OPHTHALMIC DILATING DROPS
1.0000 "application " | OPHTHALMIC | Status: AC | PRN
  Administered 2017-08-04 (×3): 1 via OPHTHALMIC

## 2017-08-04 MED ORDER — MOXIFLOXACIN HCL 0.5 % OP SOLN: 1.0000 [drp] | OPHTHALMIC | Status: DC | PRN

## 2017-08-04 MED ORDER — CARBACHOL 0.01 % IO SOLN
INTRAOCULAR | Status: DC | PRN
  Administered 2017-08-04: 0.5 mL via INTRAOCULAR

## 2017-08-04 MED ORDER — ARMC OPHTHALMIC DILATING DROPS
OPHTHALMIC | Status: AC
  Administered 2017-08-04: 1 via OPHTHALMIC
  Filled 2017-08-04: qty 0.4

## 2017-08-04 MED ORDER — EPINEPHRINE PF 1 MG/ML IJ SOLN
INTRAMUSCULAR | Status: AC
  Filled 2017-08-04: qty 2

## 2017-08-04 MED ORDER — EPINEPHRINE PF 1 MG/ML IJ SOLN
INTRAOCULAR | Status: DC | PRN
  Administered 2017-08-04: 12:00:00 via OPHTHALMIC

## 2017-08-04 MED ORDER — MIDAZOLAM HCL 2 MG/2ML IJ SOLN
INTRAMUSCULAR | Status: AC
  Filled 2017-08-04: qty 2

## 2017-08-04 MED ORDER — MOXIFLOXACIN HCL 0.5 % OP SOLN
OPHTHALMIC | Status: DC | PRN
  Administered 2017-08-04: 0.2 mL via OPHTHALMIC

## 2017-08-04 MED ORDER — NA CHONDROIT SULF-NA HYALURON 40-17 MG/ML IO SOLN
INTRAOCULAR | Status: AC
  Filled 2017-08-04: qty 1

## 2017-08-04 MED ORDER — FENTANYL CITRATE (PF) 100 MCG/2ML IJ SOLN
INTRAMUSCULAR | Status: AC
  Filled 2017-08-04: qty 2

## 2017-08-04 MED ORDER — FENTANYL CITRATE (PF) 100 MCG/2ML IJ SOLN
INTRAMUSCULAR | Status: DC | PRN
  Administered 2017-08-04: 50 ug via INTRAVENOUS

## 2017-08-04 MED ORDER — POVIDONE-IODINE 5 % OP SOLN
OPHTHALMIC | Status: DC | PRN
  Administered 2017-08-04: 1 via OPHTHALMIC

## 2017-08-04 MED ORDER — SODIUM CHLORIDE 0.9 % IV SOLN
INTRAVENOUS | Status: DC
  Administered 2017-08-04: 11:00:00 via INTRAVENOUS

## 2017-08-04 MED ORDER — NA CHONDROIT SULF-NA HYALURON 40-17 MG/ML IO SOLN
INTRAOCULAR | Status: DC | PRN
  Administered 2017-08-04: 1 mL via INTRAOCULAR

## 2017-08-04 MED ORDER — LIDOCAINE HCL (PF) 4 % IJ SOLN
INTRAMUSCULAR | Status: AC
  Filled 2017-08-04: qty 5

## 2017-08-04 MED ORDER — MOXIFLOXACIN HCL 0.5 % OP SOLN
OPHTHALMIC | Status: AC
  Filled 2017-08-04: qty 3

## 2017-08-04 SURGICAL SUPPLY — 16 items
GLOVE BIO SURGEON STRL SZ8 (GLOVE) ×3 IMPLANT
GLOVE BIOGEL M 6.5 STRL (GLOVE) ×3 IMPLANT
GLOVE SURG LX 8.0 MICRO (GLOVE) ×2
GLOVE SURG LX STRL 8.0 MICRO (GLOVE) ×1 IMPLANT
GOWN STRL REUS W/ TWL LRG LVL3 (GOWN DISPOSABLE) ×2 IMPLANT
GOWN STRL REUS W/TWL LRG LVL3 (GOWN DISPOSABLE) ×6
LABEL CATARACT MEDS ST (LABEL) ×3 IMPLANT
LENS IOL TECNIS ITEC 22.5 (Intraocular Lens) ×2 IMPLANT
PACK CATARACT (MISCELLANEOUS) ×3 IMPLANT
PACK CATARACT BRASINGTON LX (MISCELLANEOUS) ×3 IMPLANT
PACK EYE AFTER SURG (MISCELLANEOUS) ×3 IMPLANT
SOL BSS BAG (MISCELLANEOUS) ×3
SOLUTION BSS BAG (MISCELLANEOUS) ×1 IMPLANT
SYR 5ML LL (SYRINGE) ×3 IMPLANT
WATER STERILE IRR 250ML POUR (IV SOLUTION) ×3 IMPLANT
WIPE NON LINTING 3.25X3.25 (MISCELLANEOUS) ×3 IMPLANT

## 2017-08-04 NOTE — Discharge Instructions (Signed)
Eye Surgery Discharge Instructions  Expect mild scratchy sensation or mild soreness. DO NOT RUB YOUR EYE!  The day of surgery:  Minimal physical activity, but bed rest is not required  No reading, computer work, or close hand work  No bending, lifting, or straining.  May watch TV  For 24 hours:  No driving, legal decisions, or alcoholic beverages  Safety precautions  Eat anything you prefer: It is better to start with liquids, then soup then solid foods.  _____ Eye patch should be worn until postoperative exam tomorrow.  ____ Solar shield eyeglasses should be worn for comfort in the sunlight/patch while sleeping  Resume all regular medications including aspirin or Coumadin if these were discontinued prior to surgery. You may shower, bathe, shave, or wash your hair. Tylenol may be taken for mild discomfort.  Call your doctor if you experience significant pain, nausea, or vomiting, fever > 101 or other signs of infection. [phone] or [phone] Specific instructions:  Follow-up Information    Birder Robson, MD Follow up on 08/05/2017.   Specialty:  Ophthalmology Why:  at [place] information: [place] [id number] [phone]

## 2017-08-04 NOTE — Transfer of Care (Signed)
Immediate Anesthesia Transfer of Care Note  Patient: Katie Keith  Procedure(s) Performed: CATARACT EXTRACTION PHACO AND INTRAOCULAR LENS PLACEMENT (IOC) (Left Keith)  Patient Location: PACU  Anesthesia Type:MAC  Level of Consciousness: awake  Airway & Oxygen Therapy: Patient Spontanous Breathing  Post-op Assessment: Report given to RN  Post vital signs: Reviewed and stable  Last Vitals:  Vitals:   08/04/17 1103  BP: (!) 156/92  Pulse: 80  Resp: 16  Temp: (!) 36.2 C    Last Pain:  Vitals:   08/04/17 1103  TempSrc: Temporal         Complications: No apparent anesthesia complications

## 2017-08-04 NOTE — Op Note (Signed)
PREOPERATIVE DIAGNOSIS:  Nuclear sclerotic cataract of the left eye.   POSTOPERATIVE DIAGNOSIS:  Nuclear sclerotic cataract of the left eye.   OPERATIVE PROCEDURE: Procedure(s): CATARACT EXTRACTION PHACO AND INTRAOCULAR LENS PLACEMENT (IOC)   SURGEON:  Birder Robson, MD.   ANESTHESIA:  Anesthesiologist: Martha Clan, MD CRNA: Jonna Clark, CRNA  1.      Managed anesthesia care. 2.     0.24ml of Shugarcaine was instilled following the paracentesis   COMPLICATIONS:  None.   TECHNIQUE:   Stop and chop   DESCRIPTION OF PROCEDURE:  The patient was examined and consented in the preoperative holding area where the aforementioned topical anesthesia was applied to the left eye and then brought back to the Operating Room where the left eye was prepped and draped in the usual sterile ophthalmic fashion and a lid speculum was placed. A paracentesis was created with the side port blade and the anterior chamber was filled with viscoelastic. A near clear corneal incision was performed with the steel keratome. A continuous curvilinear capsulorrhexis was performed with a cystotome followed by the capsulorrhexis forceps. Hydrodissection and hydrodelineation were carried out with BSS on a blunt cannula. The lens was removed in a stop and chop  technique and the remaining cortical material was removed with the irrigation-aspiration handpiece. The capsular bag was inflated with viscoelastic and the Technis [id number] lens was placed in the capsular bag without complication. The remaining viscoelastic was removed from the eye with the irrigation-aspiration handpiece. The wounds were hydrated. The anterior chamber was flushed with Miostat and the eye was inflated to physiologic pressure. 0.40ml Vigamox was placed in the anterior chamber. The wounds were found to be water tight. The eye was dressed with Vigamox. The patient was given protective glasses to wear throughout the day and a shield with which to sleep  tonight. The patient was also given drops with which to begin a drop regimen today and will follow-up with me in one day. Implant Name Type Inv. Item Serial No. Manufacturer Lot No. LRB No. Used  LENS IOL DIOP 22.5 - [id number] Intraocular Lens LENS IOL DIOP 22.5 [id number] AMO  Left 1    Procedure(s) with comments: CATARACT EXTRACTION PHACO AND INTRAOCULAR LENS PLACEMENT (IOC) (Left) - [place] 00:40.7 AP% 14.6 CDE 5.97 Fluid Pack Lot # [id number]  Electronically signed: Birder Robson 08/04/2017 12:15 PM

## 2017-08-04 NOTE — Anesthesia Postprocedure Evaluation (Signed)
Anesthesia Post Note  Patient: Katie Keith  Procedure(s) Performed: CATARACT EXTRACTION PHACO AND INTRAOCULAR LENS PLACEMENT (IOC) (Left Eye)  Patient location during evaluation: PACU Anesthesia Type: MAC Level of consciousness: awake and alert Pain management: pain level controlled Vital Signs Assessment: post-procedure vital signs reviewed and stable Respiratory status: spontaneous breathing, nonlabored ventilation, respiratory function stable and patient connected to nasal cannula oxygen Cardiovascular status: stable and blood pressure returned to baseline Postop Assessment: no apparent nausea or vomiting Anesthetic complications: no     Last Vitals:  Vitals:   08/04/17 1217 08/04/17 1231  BP: 135/83 (!) 129/92  Pulse: 74 75  Resp: 16 16  Temp: 36.5 C   SpO2: 96% 100%    Last Pain:  Vitals:   08/04/17 1231  TempSrc: Oral                 Martha Clan

## 2017-08-04 NOTE — Anesthesia Post-op Follow-up Note (Signed)
Anesthesia QCDR form completed.        

## 2017-08-04 NOTE — Anesthesia Preprocedure Evaluation (Signed)
Anesthesia Evaluation  Patient identified by MRN, date of birth, ID band Patient awake    Reviewed: Allergy & Precautions, NPO status , Patient's Chart, lab work & pertinent test results  History of Anesthesia Complications (+) PONV and history of anesthetic complications  Airway Mallampati: II       Dental  (+) Partial Upper   Pulmonary neg pulmonary ROS,           Cardiovascular hypertension, (-) angina(-) Past MI (-) dysrhythmias (-) Valvular Problems/Murmurs     Neuro/Psych  Headaches, neg Seizures  Neuromuscular disease    GI/Hepatic negative GI ROS, Neg liver ROS,   Endo/Other  neg diabetesHypothyroidism   Renal/GU negative Renal ROS  negative genitourinary   Musculoskeletal negative musculoskeletal ROS (+)   Abdominal (+) + obese,   Peds negative pediatric ROS (+)  Hematology negative hematology ROS (+)   Anesthesia Other Findings Past Medical History: 1992: Breast cancer ([place])     Comment:  LT LUMPECTOMY 1992: Cancer ([place])     Comment:  DCIS left breast, treated with wide excision. No date: Complication of anesthesia 2006: Diverticulosis No date: Hypertension No date: Hypothyroidism No date: Migraines No date: Peripheral neuropathy     Comment:  feet No date: PONV (postoperative nausea and vomiting) No date: Thyroid disease   Reproductive/Obstetrics negative OB ROS                             Anesthesia Physical  Anesthesia Plan  ASA: II  Anesthesia Plan: MAC   Post-op Pain Management:    Induction:   PONV Risk Score and Plan:   Airway Management Planned: Nasal Cannula  Additional Equipment:   Intra-op Plan:   Post-operative Plan:   Informed Consent: I have reviewed the patients History and Physical, chart, labs and discussed the procedure including the risks, benefits and alternatives for the proposed anesthesia with the patient or authorized  representative who has indicated his/her understanding and acceptance.     Plan Discussed with: CRNA  Anesthesia Plan Comments:         Anesthesia Quick Evaluation

## 2017-08-04 NOTE — H&P (Signed)
All labs reviewed. Abnormal studies sent to patients PCP when indicated.  Previous H&P reviewed, patient examined, there are NO CHANGES.  Patrice Moates Porfilio2/26/201911:47 AM

## 2017-08-05 ENCOUNTER — Encounter: Payer: Self-pay | Admitting: Ophthalmology

## 2017-08-13 DIAGNOSIS — H2511 Age-related nuclear cataract, right eye: Secondary | ICD-10-CM | POA: Diagnosis not present

## 2017-08-14 ENCOUNTER — Encounter: Payer: Self-pay | Admitting: *Deleted

## 2017-08-18 ENCOUNTER — Encounter: Payer: Self-pay | Admitting: *Deleted

## 2017-08-18 ENCOUNTER — Ambulatory Visit: Payer: PPO | Admitting: Registered Nurse

## 2017-08-18 ENCOUNTER — Encounter
Admission: RE | Disposition: A | Discharge status: Home / Self Care | Payer: Self-pay | Source: Ambulatory Visit | Attending: Ophthalmology

## 2017-08-18 ENCOUNTER — Ambulatory Visit
Admission: RE | Admit: 2017-08-18 | Discharge: 2017-08-18 | Disposition: A | Discharge status: Home / Self Care | Payer: PPO | Source: Ambulatory Visit | Attending: Ophthalmology | Admitting: Ophthalmology

## 2017-08-18 DIAGNOSIS — Z853 Personal history of malignant neoplasm of breast: Secondary | ICD-10-CM | POA: Insufficient documentation

## 2017-08-18 DIAGNOSIS — I10 Essential (primary) hypertension: Secondary | ICD-10-CM | POA: Diagnosis not present

## 2017-08-18 DIAGNOSIS — G629 Polyneuropathy, unspecified: Secondary | ICD-10-CM | POA: Diagnosis not present

## 2017-08-18 DIAGNOSIS — Z882 Allergy status to sulfonamides status: Secondary | ICD-10-CM | POA: Diagnosis not present

## 2017-08-18 DIAGNOSIS — E039 Hypothyroidism, unspecified: Secondary | ICD-10-CM | POA: Diagnosis not present

## 2017-08-18 DIAGNOSIS — H2511 Age-related nuclear cataract, right eye: Secondary | ICD-10-CM | POA: Insufficient documentation

## 2017-08-18 DIAGNOSIS — Z8719 Personal history of other diseases of the digestive system: Secondary | ICD-10-CM | POA: Diagnosis not present

## 2017-08-18 DIAGNOSIS — Z7982 Long term (current) use of aspirin: Secondary | ICD-10-CM | POA: Diagnosis not present

## 2017-08-18 DIAGNOSIS — K219 Gastro-esophageal reflux disease without esophagitis: Secondary | ICD-10-CM | POA: Diagnosis not present

## 2017-08-18 DIAGNOSIS — Z79899 Other long term (current) drug therapy: Secondary | ICD-10-CM | POA: Insufficient documentation

## 2017-08-18 DIAGNOSIS — Z880 Allergy status to penicillin: Secondary | ICD-10-CM | POA: Diagnosis not present

## 2017-08-18 HISTORY — DX: Gastro-esophageal reflux disease without esophagitis: K21.9

## 2017-08-18 HISTORY — DX: Anemia, unspecified: D64.9

## 2017-08-18 HISTORY — PX: CATARACT EXTRACTION W/PHACO: SHX586

## 2017-08-18 SURGERY — PHACOEMULSIFICATION, CATARACT, WITH IOL INSERTION
Anesthesia: Monitor Anesthesia Care | Site: Eye | Laterality: Right | Wound class: Clean

## 2017-08-18 MED ORDER — ARMC OPHTHALMIC DILATING DROPS
1.0000 "application " | OPHTHALMIC | Status: AC
  Administered 2017-08-18 (×2): 1 via OPHTHALMIC
  Administered 2017-08-18: 10:00:00 via OPHTHALMIC

## 2017-08-18 MED ORDER — POVIDONE-IODINE 5 % OP SOLN
OPHTHALMIC | Status: DC | PRN
  Administered 2017-08-18: 1 via OPHTHALMIC

## 2017-08-18 MED ORDER — MOXIFLOXACIN HCL 0.5 % OP SOLN: 1.0000 [drp] | Freq: Once | OPHTHALMIC | Status: DC

## 2017-08-18 MED ORDER — NA CHONDROIT SULF-NA HYALURON 40-17 MG/ML IO SOLN
INTRAOCULAR | Status: AC
  Filled 2017-08-18: qty 1

## 2017-08-18 MED ORDER — ARMC OPHTHALMIC DILATING DROPS
OPHTHALMIC | Status: AC
  Filled 2017-08-18: qty 0.4

## 2017-08-18 MED ORDER — MIDAZOLAM HCL 2 MG/2ML IJ SOLN
INTRAMUSCULAR | Status: DC | PRN
  Administered 2017-08-18 (×2): 0.5 mg via INTRAVENOUS
  Administered 2017-08-18: 1 mg via INTRAVENOUS

## 2017-08-18 MED ORDER — NA CHONDROIT SULF-NA HYALURON 40-17 MG/ML IO SOLN
INTRAOCULAR | Status: DC | PRN
  Administered 2017-08-18: 1 mL via INTRAOCULAR

## 2017-08-18 MED ORDER — LIDOCAINE HCL (PF) 4 % IJ SOLN
INTRAMUSCULAR | Status: AC
  Filled 2017-08-18: qty 5

## 2017-08-18 MED ORDER — ONDANSETRON HCL 4 MG/2ML IJ SOLN
INTRAMUSCULAR | Status: DC | PRN
  Administered 2017-08-18: 4 mg via INTRAVENOUS

## 2017-08-18 MED ORDER — EPINEPHRINE PF 1 MG/ML IJ SOLN
INTRAOCULAR | Status: DC | PRN
  Administered 2017-08-18: 12:00:00 via OPHTHALMIC

## 2017-08-18 MED ORDER — POVIDONE-IODINE 5 % OP SOLN
OPHTHALMIC | Status: AC
  Filled 2017-08-18: qty 30

## 2017-08-18 MED ORDER — FENTANYL CITRATE (PF) 100 MCG/2ML IJ SOLN
INTRAMUSCULAR | Status: AC
  Filled 2017-08-18: qty 2

## 2017-08-18 MED ORDER — MIDAZOLAM HCL 2 MG/2ML IJ SOLN
INTRAMUSCULAR | Status: AC
  Filled 2017-08-18: qty 2

## 2017-08-18 MED ORDER — CARBACHOL 0.01 % IO SOLN
INTRAOCULAR | Status: DC | PRN
  Administered 2017-08-18: 0.5 mL via INTRAOCULAR

## 2017-08-18 MED ORDER — LIDOCAINE HCL (PF) 4 % IJ SOLN
INTRAOCULAR | Status: DC | PRN
  Administered 2017-08-18: 4 mL via OPHTHALMIC

## 2017-08-18 MED ORDER — EPINEPHRINE PF 1 MG/ML IJ SOLN
INTRAMUSCULAR | Status: AC
  Filled 2017-08-18: qty 2

## 2017-08-18 MED ORDER — MOXIFLOXACIN HCL 0.5 % OP SOLN
OPHTHALMIC | Status: DC | PRN
  Administered 2017-08-18: 0.2 mL via OPHTHALMIC

## 2017-08-18 MED ORDER — MOXIFLOXACIN HCL 0.5 % OP SOLN
OPHTHALMIC | Status: AC
  Filled 2017-08-18: qty 3

## 2017-08-18 MED ORDER — ONDANSETRON HCL 4 MG/2ML IJ SOLN: INTRAMUSCULAR | Status: DC | PRN

## 2017-08-18 MED ORDER — SODIUM CHLORIDE 0.9 % IV SOLN
INTRAVENOUS | Status: DC
  Administered 2017-08-18: 10:00:00 via INTRAVENOUS

## 2017-08-18 SURGICAL SUPPLY — 16 items
GLOVE BIO SURGEON STRL SZ8 (GLOVE) ×3 IMPLANT
GLOVE BIOGEL M 6.5 STRL (GLOVE) ×3 IMPLANT
GLOVE SURG LX 8.0 MICRO (GLOVE) ×2
GLOVE SURG LX STRL 8.0 MICRO (GLOVE) ×1 IMPLANT
GOWN STRL REUS W/ TWL LRG LVL3 (GOWN DISPOSABLE) ×2 IMPLANT
GOWN STRL REUS W/TWL LRG LVL3 (GOWN DISPOSABLE) ×6
LABEL CATARACT MEDS ST (LABEL) ×3 IMPLANT
LENS IOL TECNIS ITEC 22.5 (Intraocular Lens) ×2 IMPLANT
PACK CATARACT (MISCELLANEOUS) ×3 IMPLANT
PACK CATARACT BRASINGTON LX (MISCELLANEOUS) ×3 IMPLANT
PACK EYE AFTER SURG (MISCELLANEOUS) ×3 IMPLANT
SOL BSS BAG (MISCELLANEOUS) ×3
SOLUTION BSS BAG (MISCELLANEOUS) ×1 IMPLANT
SYR 5ML LL (SYRINGE) ×3 IMPLANT
WATER STERILE IRR 250ML POUR (IV SOLUTION) ×3 IMPLANT
WIPE NON LINTING 3.25X3.25 (MISCELLANEOUS) ×3 IMPLANT

## 2017-08-18 NOTE — Transfer of Care (Signed)
Immediate Anesthesia Transfer of Care Note  Patient: Katie Keith  Procedure(s) Performed: CATARACT EXTRACTION PHACO AND INTRAOCULAR LENS PLACEMENT (IOC) (Right Eye)  Patient Location: PACU  Anesthesia Type:MAC  Level of Consciousness: awake, alert  and oriented  Airway & Oxygen Therapy: Patient Spontanous Breathing  Post-op Assessment: Report given to RN and Post -op Vital signs reviewed and stable  Post vital signs: Reviewed and stable  Last Vitals:  Vitals:   08/18/17 1013 08/18/17 1203  BP: (!) 157/86 137/83  Pulse: 75 71  Resp: 16 14  Temp: 36.5 C 36.6 C  SpO2: 100% 100%    Last Pain:  Vitals:   08/18/17 1013  TempSrc: Temporal  PainSc: 0-No pain         Complications: No apparent anesthesia complications

## 2017-08-18 NOTE — Anesthesia Post-op Follow-up Note (Signed)
Anesthesia QCDR form completed.        

## 2017-08-18 NOTE — Op Note (Signed)
PREOPERATIVE DIAGNOSIS:  Nuclear sclerotic cataract of the right eye.   POSTOPERATIVE DIAGNOSIS:  NUCLEAR SCLEROTIC CATARACT RIGHT EYE   OPERATIVE PROCEDURE: Procedure(s): CATARACT EXTRACTION PHACO AND INTRAOCULAR LENS PLACEMENT (IOC)   SURGEON:  Birder Robson, MD.   ANESTHESIA:  Anesthesiologist: Piscitello, Precious Haws, MD CRNA: Dionne Bucy, CRNA; Hedda Slade, CRNA  1.      Managed anesthesia care. 2.      0.45ml of Shugarcaine was instilled in the eye following the paracentesis.   COMPLICATIONS:  None.   TECHNIQUE:   Stop and chop   DESCRIPTION OF PROCEDURE:  The patient was examined and consented in the preoperative holding area where the aforementioned topical anesthesia was applied to the right eye and then brought back to the Operating Room where the right eye was prepped and draped in the usual sterile ophthalmic fashion and a lid speculum was placed. A paracentesis was created with the side port blade and the anterior chamber was filled with viscoelastic. A near clear corneal incision was performed with the steel keratome. A continuous curvilinear capsulorrhexis was performed with a cystotome followed by the capsulorrhexis forceps. Hydrodissection and hydrodelineation were carried out with BSS on a blunt cannula. The lens was removed in a stop and chop  technique and the remaining cortical material was removed with the irrigation-aspiration handpiece. The capsular bag was inflated with viscoelastic and the Technis [id number]  lens was placed in the capsular bag without complication. The remaining viscoelastic was removed from the eye with the irrigation-aspiration handpiece. The wounds were hydrated. The anterior chamber was flushed with Miostat and the eye was inflated to physiologic pressure. 0.69ml of Vigamox was placed in the anterior chamber. The wounds were found to be water tight. The eye was dressed with Vigamox. The patient was given protective glasses to wear throughout the day  and a shield with which to sleep tonight. The patient was also given drops with which to begin a drop regimen today and will follow-up with me in one day. Implant Name Type Inv. Item Serial No. Manufacturer Lot No. LRB No. Used  LENS IOL DIOP 22.5 - [id number] Intraocular Lens LENS IOL DIOP 22.5 [id number] AMO  Right 1   Procedure(s) with comments: CATARACT EXTRACTION PHACO AND INTRAOCULAR LENS PLACEMENT (IOC) (Right) - [place] 00:51.9 AP% 17.1 CDE 8.84 Fluid Pack Lot # [id number]  Electronically signed: Birder Robson 08/18/2017 12:02 PM

## 2017-08-18 NOTE — Discharge Instructions (Signed)
Eye Surgery Discharge Instructions  Expect mild scratchy sensation or mild soreness. DO NOT RUB YOUR EYE!  The day of surgery:  Minimal physical activity, but bed rest is not required  No reading, computer work, or close hand work  No bending, lifting, or straining.  May watch TV  For 24 hours:  No driving, legal decisions, or alcoholic beverages  Safety precautions  Eat anything you prefer: It is better to start with liquids, then soup then solid foods.  _____ Eye patch should be worn until postoperative exam tomorrow.  ____ Solar shield eyeglasses should be worn for comfort in the sunlight/patch while sleeping  Resume all regular medications including aspirin or Coumadin if these were discontinued prior to surgery. You may shower, bathe, shave, or wash your hair. Tylenol may be taken for mild discomfort.  Call your doctor if you experience significant pain, nausea, or vomiting, fever > 101 or other signs of infection. [phone] or [phone] Specific instructions:  Follow-up Information    Birder Robson, MD Follow up.   Specialty:  Ophthalmology Why:  March 13 at 2:00pm Contact information: [address] [place] [address] [phone]

## 2017-08-18 NOTE — H&P (Signed)
All labs reviewed. Abnormal studies sent to patients PCP when indicated.  Previous H&P reviewed, patient examined, there are NO CHANGES.  Katie Fawaz Porfilio3/12/201911:34 AM

## 2017-08-18 NOTE — Anesthesia Preprocedure Evaluation (Signed)
Anesthesia Evaluation  Patient identified by MRN, date of birth, ID band Patient awake    Reviewed: Allergy & Precautions, NPO status , Patient's Chart, lab work & pertinent test results  History of Anesthesia Complications (+) PONV and history of anesthetic complications  Airway Mallampati: II       Dental  (+) Partial Upper   Pulmonary neg pulmonary ROS,           Cardiovascular hypertension, (-) angina(-) Past MI (-) dysrhythmias (-) Valvular Problems/Murmurs     Neuro/Psych  Headaches, neg Seizures  Neuromuscular disease    GI/Hepatic negative GI ROS, Neg liver ROS, GERD  ,  Endo/Other  neg diabetesHypothyroidism   Renal/GU negative Renal ROS  negative genitourinary   Musculoskeletal negative musculoskeletal ROS (+)   Abdominal (+) + obese,   Peds negative pediatric ROS (+)  Hematology negative hematology ROS (+)   Anesthesia Other Findings Past Medical History: 1992: Breast cancer ([place])     Comment:  LT LUMPECTOMY 1992: Cancer ([place])     Comment:  DCIS left breast, treated with wide excision. No date: Complication of anesthesia 2006: Diverticulosis No date: Hypertension No date: Hypothyroidism No date: Migraines No date: Peripheral neuropathy     Comment:  feet No date: PONV (postoperative nausea and vomiting) No date: Thyroid disease   Reproductive/Obstetrics negative OB ROS                             Anesthesia Physical  Anesthesia Plan  ASA: II  Anesthesia Plan: MAC   Post-op Pain Management:    Induction: Intravenous  PONV Risk Score and Plan:   Airway Management Planned: Nasal Cannula and Natural Airway  Additional Equipment:   Intra-op Plan:   Post-operative Plan:   Informed Consent: I have reviewed the patients History and Physical, chart, labs and discussed the procedure including the risks, benefits and alternatives for the proposed anesthesia with  the patient or authorized representative who has indicated his/her understanding and acceptance.   Dental Advisory Given  Plan Discussed with: CRNA  Anesthesia Plan Comments: (Patient consented for risks of anesthesia including but not limited to:  - adverse reactions to medications - damage to teeth, lips or other oral mucosa - sore throat or hoarseness - Damage to heart, brain, lungs or loss of life  Patient voiced understanding.)        Anesthesia Quick Evaluation

## 2017-08-18 NOTE — Anesthesia Postprocedure Evaluation (Signed)
Anesthesia Post Note  Patient: Katie Keith  Procedure(s) Performed: CATARACT EXTRACTION PHACO AND INTRAOCULAR LENS PLACEMENT (IOC) (Right Eye)  Patient location during evaluation: PACU Anesthesia Type: MAC Level of consciousness: awake Pain management: pain level controlled Vital Signs Assessment: post-procedure vital signs reviewed and stable Respiratory status: spontaneous breathing Cardiovascular status: blood pressure returned to baseline Postop Assessment: no apparent nausea or vomiting Anesthetic complications: no     Last Vitals:  Vitals:   08/18/17 1013 08/18/17 1203  BP: (!) 157/86 137/83  Pulse: 75 71  Resp: 16 14  Temp: 36.5 C 36.6 C  SpO2: 100% 100%    Last Pain:  Vitals:   08/18/17 1013  TempSrc: Temporal  PainSc: 0-No pain                 Simmie Camerer Lorenza Chick

## 2017-09-08 ENCOUNTER — Ambulatory Visit: Payer: PPO | Admitting: Family Medicine

## 2017-09-08 ENCOUNTER — Ambulatory Visit (INDEPENDENT_AMBULATORY_CARE_PROVIDER_SITE_OTHER): Payer: PPO | Admitting: Family Medicine

## 2017-09-08 ENCOUNTER — Encounter: Payer: Self-pay | Admitting: Family Medicine

## 2017-09-08 VITALS — BP 116/70 | HR 68 | Temp 97.8°F | Resp 14 | Wt 153.0 lb

## 2017-09-08 DIAGNOSIS — E038 Other specified hypothyroidism: Secondary | ICD-10-CM | POA: Diagnosis not present

## 2017-09-08 DIAGNOSIS — R739 Hyperglycemia, unspecified: Secondary | ICD-10-CM | POA: Diagnosis not present

## 2017-09-08 DIAGNOSIS — I10 Essential (primary) hypertension: Secondary | ICD-10-CM

## 2017-09-08 DIAGNOSIS — R1084 Generalized abdominal pain: Secondary | ICD-10-CM | POA: Diagnosis not present

## 2017-09-08 DIAGNOSIS — F419 Anxiety disorder, unspecified: Secondary | ICD-10-CM

## 2017-09-08 NOTE — Progress Notes (Signed)
Patient: Katie Keith Female    DOB: 27-Jan-1946   72 y.o.   MRN: [id number] Visit Date: 09/08/2017  Today's Provider: Wilhemena Durie, MD   Chief Complaint  Patient presents with  . Hypertension   Subjective:    HPI  Hypertension, follow-up:  BP Readings from Last 3 Encounters:  09/08/17 116/70  08/18/17 (!) 152/85  08/04/17 (!) 129/92    She was last seen for hypertension 6 months ago.  BP at that visit was 152/85. Management since that visit includes none. She reports good compliance with treatment. She is not having side effects.  She is not exercising, formly but does do house work, and gardening.  Outside blood pressures are not being checked. Patient denies chest pain, chest pressure/discomfort, claudication, dyspnea, exertional chest pressure/discomfort, fatigue, irregular heart beat, lower extremity edema, near-syncope, orthopnea, palpitations, paroxysmal nocturnal dyspnea, syncope and tachypnea.   Wt Readings from Last 3 Encounters:  09/08/17 153 lb (69.4 kg)  08/14/17 154 lb (69.9 kg)  08/04/17 154 lb (69.9 kg)   ------------------------------------------------------------------------  Pt was seen on 07/11/17 for chest pain and epigastric pain. She reports this has resolved. Pt also reports that over the last couple of days she has been having a "bout of diverticulitis" she had lower abdomen and frequent trips to the bathroom for BM and some chills last night . She reports that she ate corn and this more than likely did it. She reports that she is getting better. Pt also would like to discuss her anxiety. Her husband has cancer and reports that she gets anxious. She would like something to help her through these moments.    Office Visit from 09/08/2017 in [facility]  PHQ-9 Total Score  2     GAD 7 : Generalized Anxiety Score 09/08/2017  Nervous, Anxious, on Edge 1  Control/stop worrying 1  Worry too much - different things 1  Trouble  relaxing 0  Restless 0  Easily annoyed or irritable 0  Afraid - awful might happen 0  Total GAD 7 Score 3  Anxiety Difficulty Not difficult at all        Allergies  Allergen Reactions  . Penicillins Swelling and Other (See Comments)    Has patient had a PCN reaction causing immediate rash, facial/tongue/throat swelling, SOB or lightheadedness with hypotension: Yes Has patient had a PCN reaction causing severe rash involving mucus membranes or skin necrosis: No Has patient had a PCN reaction that required hospitalization: No Has patient had a PCN reaction occurring within the last 10 years: No If all of the above answers are "NO", then may proceed with Cephalosporin use.   . Sulfa Antibiotics Swelling and Rash    swelling     Current Outpatient Medications:  .  acetaminophen (TYLENOL) 325 MG tablet, Take 325-975 mg by mouth every 6 (six) hours as needed for moderate pain or headache., Disp: , Rfl:  .  aspirin 81 MG tablet, Take 81 mg by mouth daily. In the evening, Disp: , Rfl:  .  Biotin 5000 MCG CAPS, Take 5,000 mcg by mouth daily. , Disp: , Rfl:  .  Calcium Carbonate-Vitamin D (CALCIUM + D PO), Take 1 tablet by mouth daily. In the morning, Disp: , Rfl:  .  carvedilol (COREG) 6.25 MG tablet, TAKE ONE TABLET BY MOUTH TWICE DAILY (Patient taking differently: TAKE 6.25 MG BY MOUTH TWICE DAILY), Disp: 60 tablet, Rfl: 12 .  CINNAMON PO, Take 1 capsule  by mouth daily. In the morning, Disp: , Rfl:  .  cyanocobalamin (,VITAMIN B-12,) 1000 MCG/ML injection, INJECT 1ML TWICE A MONTH (Patient taking differently: Inject 1000 mcg IM every 2 weeks), Disp: 6 mL, Rfl: 3 .  ibuprofen (ADVIL,MOTRIN) 200 MG tablet, Take 200-400 mg by mouth every 6 (six) hours as needed for headache or moderate pain., Disp: , Rfl:  .  levothyroxine (SYNTHROID, LEVOTHROID) 112 MCG tablet, TAKE ONE TABLET BY MOUTH ONCE DAILY BEFORE BREAKFAST (Patient taking differently: TAKE 112 MCG BY MOUTH ONCE DAILY BEFORE  BREAKFAST), Disp: 30 tablet, Rfl: 12 .  Melatonin 5 MG CAPS, Take by mouth at bedtime., Disp: , Rfl:  .  NEEDLE, DISP, 25 G 25G X 1" MISC, Inject B12 twice a month, Disp: 10 each, Rfl: 12 .  Omega-3 Fatty Acids (FISH OIL) 1000 MG CAPS, Take 1,000 mg by mouth daily. In the morning, Disp: , Rfl:  .  OVER THE COUNTER MEDICATION, Take 1 capsule by mouth 2 (two) times daily. Alphoric Acid, Disp: , Rfl:  .  sodium chloride (OCEAN) 0.65 % SOLN nasal spray, Place 1 spray into both nostrils as needed for congestion., Disp: , Rfl:  .  Syringe, Disposable, 3 ML MISC, Use to inject B12 twice a month, Disp: 25 each, Rfl: 12 .  Hyoscyamine Sulfate SL (LEVSIN/SL) 0.125 MG SUBL, Place 1 tablet under the tongue 3 (three) times daily as needed. (Patient not taking: Reported on 07/23/2017), Disp: 12 each, Rfl: 0  Review of Systems  Constitutional: Positive for chills.  HENT: Negative.   Eyes: Negative.   Respiratory: Negative.   Cardiovascular: Negative.   Gastrointestinal: Positive for abdominal pain and diarrhea.  Endocrine: Negative.   Genitourinary: Negative.   Musculoskeletal: Negative.   Skin: Negative.   Allergic/Immunologic: Negative.   Neurological: Negative.   Hematological: Negative.   Psychiatric/Behavioral: The patient is nervous/anxious.     Social History   Tobacco Use  . Smoking status: Never Smoker  . Smokeless tobacco: Never Used  Substance Use Topics  . Alcohol use: No   Objective:   BP 116/70 (BP Location: Left Arm, Patient Position: Sitting, Cuff Size: Normal)   Pulse 68   Temp 97.8 F (36.6 C) (Oral)   Resp 14   Wt 153 lb (69.4 kg)   BMI 27.98 kg/m  Vitals:   09/08/17 1342  BP: 116/70  Pulse: 68  Resp: 14  Temp: 97.8 F (36.6 C)  TempSrc: Oral  Weight: 153 lb (69.4 kg)     Physical Exam  Constitutional: She is oriented to person, place, and time. She appears well-developed and well-nourished.  HENT:  Head: Normocephalic and atraumatic.  Eyes: Pupils are  equal, round, and reactive to light. Conjunctivae and EOM are normal.  Neck: Normal range of motion. Neck supple.  Cardiovascular: Normal rate, regular rhythm, normal heart sounds and intact distal pulses.  Pulmonary/Chest: Effort normal and breath sounds normal.  Abdominal: Soft. Bowel sounds are normal.  Musculoskeletal: Normal range of motion.  Neurological: She is alert and oriented to person, place, and time. She has normal reflexes.  Skin: Skin is warm and dry.  Psychiatric: She has a normal mood and affect. Her behavior is normal. Judgment and thought content normal.        Assessment & Plan:     1. Essential hypertension  - CBC with Differential/Platelet  2. Anxiety Husband presently doing ok.  3. Hyperglycemia  - Hemoglobin A1c  4. Other specified hypothyroidism  - TSH  5.  Generalized abdominal pain  - Comprehensive metabolic panel.     HPI, Exam, and A&P Transcribed under the direction and in the presence of Anaiya Wisinski L. Cranford Mon, MD  Electronically Signed: Katina Dung, CMA  I have done the exam and reviewed the above chart and it is accurate to the best of my knowledge. Development worker, community has been used in this note in any air is in the dictation or transcription are unintentional.  Wilhemena Durie, MD  [place]

## 2017-09-09 LAB — COMPREHENSIVE METABOLIC PANEL
ALT: 13 IU/L (ref 0–32)
AST: 25 IU/L (ref 0–40)
Albumin/Globulin Ratio: 1.4 (ref 1.2–2.2)
Albumin: 4.1 g/dL (ref 3.5–4.8)
Alkaline Phosphatase: 94 IU/L (ref 39–117)
BUN/Creatinine Ratio: 23 (ref 12–28)
BUN: 17 mg/dL (ref 8–27)
Bilirubin Total: 0.4 mg/dL (ref 0.0–1.2)
CALCIUM: 9.6 mg/dL (ref 8.7–10.3)
CO2: 21 mmol/L (ref 20–29)
Chloride: 101 mmol/L (ref 96–106)
Creatinine, Ser: 0.74 mg/dL (ref 0.57–1.00)
GFR, EST AFRICAN AMERICAN: 93 mL/min/{1.73_m2} (ref >59)
GFR, EST NON AFRICAN AMERICAN: 81 mL/min/{1.73_m2} (ref >59)
GLUCOSE: 109 mg/dL — AB (ref 65–99)
Globulin, Total: 3 g/dL (ref 1.5–4.5)
Potassium: 4 mmol/L (ref 3.5–5.2)
Sodium: 140 mmol/L (ref 134–144)
TOTAL PROTEIN: 7.1 g/dL (ref 6.0–8.5)

## 2017-09-09 LAB — CBC WITH DIFFERENTIAL/PLATELET
BASOS ABS: 0 10*3/uL (ref 0.0–0.2)
BASOS: 0 %
EOS (ABSOLUTE): 0.2 10*3/uL (ref 0.0–0.4)
Eos: 2 %
HEMOGLOBIN: 13.2 g/dL (ref 11.1–15.9)
Hematocrit: 40.5 % (ref 34.0–46.6)
IMMATURE GRANS (ABS): 0 10*3/uL (ref 0.0–0.1)
Immature Granulocytes: 0 %
LYMPHS: 19 %
Lymphocytes Absolute: 1.7 10*3/uL (ref 0.7–3.1)
MCH: 26 pg — AB (ref 26.6–33.0)
MCHC: 32.6 g/dL (ref 31.5–35.7)
MCV: 80 fL (ref 79–97)
MONOCYTES: 9 %
Monocytes Absolute: 0.8 10*3/uL (ref 0.1–0.9)
NEUTROS ABS: 6.4 10*3/uL (ref 1.4–7.0)
Neutrophils: 70 %
Platelets: 270 10*3/uL (ref 150–379)
RBC: 5.08 x10E6/uL (ref 3.77–5.28)
RDW: 16.9 % — ABNORMAL HIGH (ref 12.3–15.4)
WBC: 9.2 10*3/uL (ref 3.4–10.8)

## 2017-09-09 LAB — TSH: TSH: 0.72 u[IU]/mL (ref 0.450–4.500)

## 2017-09-09 LAB — HEMOGLOBIN A1C
Est. average glucose Bld gHb Est-mCnc: 123 mg/dL
HEMOGLOBIN A1C: 5.9 % — AB (ref 4.8–5.6)

## 2017-09-10 ENCOUNTER — Telehealth: Payer: Self-pay

## 2017-09-10 NOTE — Telephone Encounter (Signed)
Left message to call back  

## 2017-09-10 NOTE — Telephone Encounter (Signed)
-----   Message from Jerrol Banana., MD sent at 09/09/2017  4:37 PM EDT ----- Labs OK

## 2017-09-14 NOTE — Telephone Encounter (Signed)
LMT[facility] ED 

## 2017-09-15 NOTE — Telephone Encounter (Signed)
Pt advised.

## 2017-09-17 DIAGNOSIS — Z961 Presence of intraocular lens: Secondary | ICD-10-CM | POA: Diagnosis not present

## 2017-10-26 ENCOUNTER — Ambulatory Visit
Admission: RE | Admit: 2017-10-26 | Discharge: 2017-10-26 | Disposition: A | Discharge status: Home / Self Care | Payer: PPO | Source: Ambulatory Visit | Attending: Family Medicine | Admitting: Family Medicine

## 2017-10-26 DIAGNOSIS — Z1231 Encounter for screening mammogram for malignant neoplasm of breast: Secondary | ICD-10-CM | POA: Diagnosis not present

## 2017-11-02 ENCOUNTER — Other Ambulatory Visit: Payer: Self-pay | Admitting: Family Medicine

## 2017-11-02 DIAGNOSIS — E538 Deficiency of other specified B group vitamins: Secondary | ICD-10-CM

## 2018-01-25 ENCOUNTER — Other Ambulatory Visit: Payer: Self-pay | Admitting: Family Medicine

## 2018-03-18 DIAGNOSIS — H353131 Nonexudative age-related macular degeneration, bilateral, early dry stage: Secondary | ICD-10-CM | POA: Diagnosis not present

## 2018-03-22 ENCOUNTER — Encounter: Payer: Self-pay | Admitting: Family Medicine

## 2018-04-05 ENCOUNTER — Ambulatory Visit (INDEPENDENT_AMBULATORY_CARE_PROVIDER_SITE_OTHER): Payer: PPO | Admitting: Family Medicine

## 2018-04-05 ENCOUNTER — Ambulatory Visit (INDEPENDENT_AMBULATORY_CARE_PROVIDER_SITE_OTHER): Payer: PPO

## 2018-04-05 VITALS — BP 102/68 | HR 88 | Temp 98.4°F | Ht 63.0 in | Wt 142.0 lb

## 2018-04-05 VITALS — BP 102/68 | HR 88 | Temp 98.4°F | Resp 16 | Ht 63.0 in | Wt 142.0 lb

## 2018-04-05 DIAGNOSIS — Z13820 Encounter for screening for osteoporosis: Secondary | ICD-10-CM

## 2018-04-05 DIAGNOSIS — Z23 Encounter for immunization: Secondary | ICD-10-CM | POA: Diagnosis not present

## 2018-04-05 DIAGNOSIS — Z0000 Encounter for general adult medical examination without abnormal findings: Secondary | ICD-10-CM | POA: Diagnosis not present

## 2018-04-05 NOTE — Patient Instructions (Addendum)
Katie Keith , Thank you for taking time to come for your Medicare Wellness Visit. I appreciate your ongoing commitment to your health goals. Please review the following plan we discussed and let me know if I can assist you in the future.   Screening recommendations/referrals: Colonoscopy: Up to date Mammogram: Up to date Bone Density: Referral sent today.  Recommended yearly ophthalmology/optometry visit for glaucoma screening and checkup Recommended yearly dental visit for hygiene and checkup  Vaccinations: Influenza vaccine: Up to date Pneumococcal vaccine: Pt declines today.  Tdap vaccine: Pt declines today.  Shingles vaccine: Pt declines today.     Advanced directives: Please bring a copy of your POA (Power of Attorney) and/or Living Will to your next appointment.   Conditions/risks identified: Continue increasing water intake to 6 glasses every day.   Next appointment: 2:00 PM today with Dr Rosanna Randy.    Preventive Care 16 Years and Older, Female Preventive care refers to lifestyle choices and visits with your health care provider that can promote health and wellness. What does preventive care include?  A yearly physical exam. This is also called an annual well check.  Dental exams once or twice a year.  Routine eye exams. Ask your health care provider how often you should have your eyes checked.  Personal lifestyle choices, including:  Daily care of your teeth and gums.  Regular physical activity.  Eating a healthy diet.  Avoiding tobacco and drug use.  Limiting alcohol use.  Practicing safe sex.  Taking low-dose aspirin every day.  Taking vitamin and mineral supplements as recommended by your health care provider. What happens during an annual well check? The services and screenings done by your health care provider during your annual well check will depend on your age, overall health, lifestyle risk factors, and family history of disease. Counseling  Your  health care provider may ask you questions about your:  Alcohol use.  Tobacco use.  Drug use.  Emotional well-being.  Home and relationship well-being.  Sexual activity.  Eating habits.  History of falls.  Memory and ability to understand (cognition).  Work and work Statistician.  Reproductive health. Screening  You may have the following tests or measurements:  Height, weight, and BMI.  Blood pressure.  Lipid and cholesterol levels. These may be checked every 5 years, or more frequently if you are over 70 years old.  Skin check.  Lung cancer screening. You may have this screening every year starting at age 82 if you have a 30-pack-year history of smoking and currently smoke or have quit within the past 15 years.  Fecal occult blood test (FOBT) of the stool. You may have this test every year starting at age 33.  Flexible sigmoidoscopy or colonoscopy. You may have a sigmoidoscopy every 5 years or a colonoscopy every 10 years starting at age 33.  Hepatitis C blood test.  Hepatitis B blood test.  Sexually transmitted disease (STD) testing.  Diabetes screening. This is done by checking your blood sugar (glucose) after you have not eaten for a while (fasting). You may have this done every 1-3 years.  Bone density scan. This is done to screen for osteoporosis. You may have this done starting at age 43.  Mammogram. This may be done every 1-2 years. Talk to your health care provider about how often you should have regular mammograms. Talk with your health care provider about your test results, treatment options, and if necessary, the need for more tests. Vaccines  Your health care  provider may recommend certain vaccines, such as:  Influenza vaccine. This is recommended every year.  Tetanus, diphtheria, and acellular pertussis (Tdap, Td) vaccine. You may need a Td booster every 10 years.  Zoster vaccine. You may need this after age 47.  Pneumococcal 13-valent  conjugate (PCV13) vaccine. One dose is recommended after age 31.  Pneumococcal polysaccharide (PPSV23) vaccine. One dose is recommended after age 50. Talk to your health care provider about which screenings and vaccines you need and how often you need them. This information is not intended to replace advice given to you by your health care provider. Make sure you discuss any questions you have with your health care provider. Document Released: 06/22/2015 Document Revised: 02/13/2016 Document Reviewed: 03/27/2015 Elsevier Interactive Patient Education  2017 [place] Prevention in the Home Falls can cause injuries. They can happen to people of all ages. There are many things you can do to make your home safe and to help prevent falls. What can I do on the outside of my home?  Regularly fix the edges of walkways and driveways and fix any cracks.  Remove anything that might make you trip as you walk through a door, such as a raised step or threshold.  Trim any bushes or trees on the path to your home.  Use bright outdoor lighting.  Clear any walking paths of anything that might make someone trip, such as rocks or tools.  Regularly check to see if handrails are loose or broken. Make sure that both sides of any steps have handrails.  Any raised decks and porches should have guardrails on the edges.  Have any leaves, snow, or ice cleared regularly.  Use sand or salt on walking paths during winter.  Clean up any spills in your garage right away. This includes oil or grease spills. What can I do in the bathroom?  Use night lights.  Install grab bars by the toilet and in the tub and shower. Do not use towel bars as grab bars.  Use non-skid mats or decals in the tub or shower.  If you need to sit down in the shower, use a plastic, non-slip stool.  Keep the floor dry. Clean up any water that spills on the floor as soon as it happens.  Remove soap buildup in the tub or  shower regularly.  Attach bath mats securely with double-sided non-slip rug tape.  Do not have throw rugs and other things on the floor that can make you trip. What can I do in the bedroom?  Use night lights.  Make sure that you have a light by your bed that is easy to reach.  Do not use any sheets or blankets that are too big for your bed. They should not hang down onto the floor.  Have a firm chair that has side arms. You can use this for support while you get dressed.  Do not have throw rugs and other things on the floor that can make you trip. What can I do in the kitchen?  Clean up any spills right away.  Avoid walking on wet floors.  Keep items that you use a lot in easy-to-reach places.  If you need to reach something above you, use a strong step stool that has a grab bar.  Keep electrical cords out of the way.  Do not use floor polish or wax that makes floors slippery. If you must use wax, use non-skid floor wax.  Do not have throw  rugs and other things on the floor that can make you trip. What can I do with my stairs?  Do not leave any items on the stairs.  Make sure that there are handrails on both sides of the stairs and use them. Fix handrails that are broken or loose. Make sure that handrails are as long as the stairways.  Check any carpeting to make sure that it is firmly attached to the stairs. Fix any carpet that is loose or worn.  Avoid having throw rugs at the top or bottom of the stairs. If you do have throw rugs, attach them to the floor with carpet tape.  Make sure that you have a light switch at the top of the stairs and the bottom of the stairs. If you do not have them, ask someone to add them for you. What else can I do to help prevent falls?  Wear shoes that:  Do not have high heels.  Have rubber bottoms.  Are comfortable and fit you well.  Are closed at the toe. Do not wear sandals.  If you use a stepladder:  Make sure that it is fully  opened. Do not climb a closed stepladder.  Make sure that both sides of the stepladder are locked into place.  Ask someone to hold it for you, if possible.  Clearly mark and make sure that you can see:  Any grab bars or handrails.  First and last steps.  Where the edge of each step is.  Use tools that help you move around (mobility aids) if they are needed. These include:  Canes.  Walkers.  Scooters.  Crutches.  Turn on the lights when you go into a dark area. Replace any light bulbs as soon as they burn out.  Set up your furniture so you have a clear path. Avoid moving your furniture around.  If any of your floors are uneven, fix them.  If there are any pets around you, be aware of where they are.  Review your medicines with your doctor. Some medicines can make you feel dizzy. This can increase your chance of falling. Ask your doctor what other things that you can do to help prevent falls. This information is not intended to replace advice given to you by your health care provider. Make sure you discuss any questions you have with your health care provider. Document Released: 03/22/2009 Document Revised: 11/01/2015 Document Reviewed: 06/30/2014 Elsevier Interactive Patient Education  2017 [employer].

## 2018-04-05 NOTE — Progress Notes (Signed)
Subjective:   Katie Keith is a 72 y.o. female who presents for Medicare Annual (Subsequent) preventive examination.  Review of Systems:  N/A  Cardiac Risk Factors include: advanced age (>30men, >60 women);dyslipidemia;hypertension     Objective:     Vitals: BP 102/68 (BP Location: Right Arm)   Pulse 88   Temp 98.4 F (36.9 C) (Oral)   Ht 5\' 3"  (1.6 m)   Wt 142 lb (64.4 kg)   BMI 25.15 kg/m   Body mass index is 25.15 kg/m.  Advanced Directives 04/05/2018 08/04/2017 02/18/2017 07/04/2015  Does Patient Have a Medical Advance Directive? Yes Yes Yes Yes  Type of Paramedic of [place];Living will [place];Living will [place];Living will Living will;Healthcare Power of [place] in Chart? No - copy requested No - copy requested No - copy requested -    Tobacco Social History   Tobacco Use  Smoking Status Never Smoker  Smokeless Tobacco Never Used     Counseling given: Not Answered   Clinical Intake:     Pain : No/denies pain Pain Score: 0-No pain     Nutritional Status: BMI 25 -29 Overweight Nutritional Risks: None Diabetes: No  How often do you need to have someone help you when you read instructions, pamphlets, or other written materials from your doctor or pharmacy?: 1 - Never  Interpreter Needed?: No  Information entered by :: [facility], LPN  Past Medical History:  Diagnosis Date  . Anemia    b12 injections twice a month  . Breast cancer ([place]) 1992   LT LUMPECTOMY DCIS  . Cancer ([place]) 1992   DCIS left breast, treated with wide excision.  . Complication of anesthesia   . Diverticulosis 2006  . GERD (gastroesophageal reflux disease)   . Hypertension   . Hypothyroidism   . Migraines   . Peripheral neuropathy    feet  . PONV (postoperative nausea and vomiting)   . Thyroid disease    Past Surgical History:  Procedure Laterality Date  .  BREAST BIOPSY Left 03/21/2016   2 areas done by byrnett fibrocystic changes  . BREAST LUMPECTOMY Left 1993   DCIS, no chemo no radation  . BREAST SURGERY Left 1992   wide excision for DCIS  . CATARACT EXTRACTION W/PHACO Left 08/04/2017   Procedure: CATARACT EXTRACTION PHACO AND INTRAOCULAR LENS PLACEMENT (IOC);  Surgeon: Birder Robson, MD;  Location: [facility] ORS;  Service: Ophthalmology;  Laterality: Left;  [place] 00:40.7 AP% 14.6 CDE 5.97 Fluid Pack Lot # [id number]  . CATARACT EXTRACTION W/PHACO Right 08/18/2017   Procedure: CATARACT EXTRACTION PHACO AND INTRAOCULAR LENS PLACEMENT ([place]);  Surgeon: Birder Robson, MD;  Location: [facility] ORS;  Service: Ophthalmology;  Laterality: Right;  [place] 00:51.9 AP% 17.1 CDE 8.84 Fluid Pack Lot # [id number]  . COLONOSCOPY  2006   Dr Bary Castilla  . COLONOSCOPY WITH PROPOFOL N/A 04/18/2015   Procedure: COLONOSCOPY WITH PROPOFOL;  Surgeon: Robert Bellow, MD;  Location: [facility] ENDOSCOPY;  Service: Endoscopy;  Laterality: N/A;  . HERNIA REPAIR    . thumb surgery     injury with infection   Family History  Problem Relation Age of Onset  . Breast cancer Mother        62'S  . Diabetes Mother   . Lung cancer Father   . Cancer Sister 71       breast  . Breast cancer Sister 40  . Breast cancer Paternal  Grandmother        70'S  . Diverticulitis Son        part of colon removed  . Neuropathy Son   . Diabetes Sister   . Hypertension Sister   . Neuropathy Son   . Diverticulitis Son    Social History   Socioeconomic History  . Marital status: Widowed    Spouse name: Not on file  . Number of children: 3  . Years of education: Not on file  . Highest education level: Associate degree: occupational, Hotel manager, or vocational program  Occupational History  . Occupation: retired  Scientific laboratory technician  . Financial resource strain: Not hard at all  . Food insecurity:    Worry: Never true    Inability: Never true  . Transportation needs:    Medical: No     Non-medical: No  Tobacco Use  . Smoking status: Never Smoker  . Smokeless tobacco: Never Used  Substance and Sexual Activity  . Alcohol use: No  . Drug use: No  . Sexual activity: Not on file  Lifestyle  . Physical activity:    Days per week: 0 days    Minutes per session: 0 min  . Stress: To some extent  Relationships  . Social connections:    Talks on phone: Patient refused    Gets together: Patient refused    Attends religious service: Patient refused    Active member of club or organization: Patient refused    Attends meetings of clubs or organizations: Patient refused    Relationship status: Patient refused  Other Topics Concern  . Not on file  Social History Narrative   Husband passed in 11/2017.    Outpatient Encounter Medications as of 04/05/2018  Medication Sig  . acetaminophen (TYLENOL) 325 MG tablet Take 325-975 mg by mouth every 6 (six) hours as needed for moderate pain or headache.  . Artificial Tear Ointment (DRY EYES OP) Apply to eye as needed.  Marland Kitchen aspirin 81 MG tablet Take 81 mg by mouth daily. In the evening  . Biotin 5000 MCG CAPS Take 5,000 mcg by mouth daily.   . Calcium Carbonate-Vitamin D (CALCIUM + D PO) Take 1 tablet by mouth daily. In the morning  . carvedilol (COREG) 6.25 MG tablet TAKE ONE TABLET BY MOUTH TWICE DAILY (Patient taking differently: TAKE 6.25 MG BY MOUTH TWICE DAILY)  . CINNAMON PO Take 1 capsule by mouth daily. In the morning  . cyanocobalamin (,VITAMIN B-12,) 1000 MCG/ML injection Inject 1000 mcg IM every 2 weeks (Patient taking differently: Inject 1000 mcg IM every 2 weeks)  . ibuprofen (ADVIL,MOTRIN) 200 MG tablet Take 200-400 mg by mouth every 6 (six) hours as needed for headache or moderate pain.  Marland Kitchen levothyroxine (SYNTHROID, LEVOTHROID) 112 MCG tablet TAKE 1 TABLET BY MOUTH ONCE DAILY BEFORE BREAKFAST  . Melatonin 5 MG CAPS Take by mouth at bedtime.  Marland Kitchen NEEDLE, DISP, 25 G 25G X 1" MISC Inject B12 twice a month  . Omega-3 Fatty Acids  (FISH OIL) 1000 MG CAPS Take 1,000 mg by mouth daily. In the morning  . OVER THE COUNTER MEDICATION Take 1 capsule by mouth 2 (two) times daily. Alphoric Acid  . sodium chloride (OCEAN) 0.65 % SOLN nasal spray Place 1 spray into both nostrils as needed for congestion.  . Syringe, Disposable, 3 ML MISC Use to inject B12 twice a month  . Hyoscyamine Sulfate SL (LEVSIN/SL) 0.125 MG SUBL Place 1 tablet under the tongue 3 (three) times daily  as needed. (Patient not taking: Reported on 07/23/2017)   No facility-administered encounter medications on file as of 04/05/2018.     Activities of Daily Living In your present state of health, do you have any difficulty performing the following activities: 04/05/2018  Hearing? Y  Comment Some with background noises. Does not wear hearing aids.   Vision? N  Difficulty concentrating or making decisions? N  Walking or climbing stairs? N  Dressing or bathing? N  Doing errands, shopping? N  Preparing Food and eating ? N  Using the Toilet? N  In the past six months, have you accidently leaked urine? N  Do you have problems with loss of bowel control? N  Managing your Medications? N  Managing your Finances? N  Housekeeping or managing your Housekeeping? N  Some recent data might be hidden    Patient Care Team: Jerrol Banana., MD as PCP - General (Family Medicine) Bary Castilla, Forest Gleason, MD (General Surgery) Vladimir Crofts, MD as Consulting Physician (Neurology) Carloyn Manner, MD as Referring Physician (Otolaryngology)    Assessment:   This is a routine wellness examination for Terrisha.  Exercise Activities and Dietary recommendations Current Exercise Habits: The patient does not participate in regular exercise at present, Exercise limited by: None identified  Goals    . Increase water intake     Recommend increasing water intake to 6 glasses a day.        Fall Risk Fall Risk  04/05/2018 02/18/2017 10/19/2015 02/09/2015  Falls in the past  year? No No No No   FALL RISK PREVENTION PERTAINING TO THE HOME:  Any stairs in or around the home WITH handrails? Yes  Home free of loose throw rugs in walkways, pet beds, electrical cords, etc? Yes  Adequate lighting in your home to reduce risk of falls? Yes   ASSISTIVE DEVICES UTILIZED TO PREVENT FALLS:  Life alert? No  Use of a cane, walker or w/c? No  Grab bars in the bathroom? Yes  Shower chair or bench in shower? No  Elevated toilet seat or a handicapped toilet? Yes    TIMED UP AND GO:  Was the test performed? No .     Depression Screen PHQ 2/9 Scores 04/05/2018 09/08/2017 02/18/2017 02/18/2017  PHQ - 2 Score 0 0 0 0  PHQ- 9 Score - 2 3 -     Cognitive Function: Declined today.      6CIT Screen 02/18/2017  What Year? 0 points  What month? 0 points  What time? 0 points  Count back from 20 0 points  Months in reverse 2 points  Repeat phrase 2 points  Total Score 4    Immunization History  Administered Date(s) Administered  . Influenza, High Dose Seasonal PF 02/18/2017, 04/05/2018  . Tdap 05/19/2007  . Zoster 07/19/2008    Qualifies for Shingles Vaccine? Yes  Zostavax completed 07/19/08. Due for Shingrix. Education has been provided regarding the importance of this vaccine. Pt has been advised to call insurance company to determine out of pocket expense. Advised may also receive vaccine at local pharmacy or Health Dept. Verbalized acceptance and understanding.  Tdap: Although this vaccine is not a covered service during a Wellness Exam, does the patient still wish to receive this vaccine today?  No .  Education has been provided regarding the importance of this vaccine. Advised may receive this vaccine at local pharmacy or Health Dept. Aware to provide a copy of the vaccination record if obtained from local pharmacy  or Health Dept. Verbalized acceptance and understanding.  Flu Vaccine: Due for Flu vaccine. Does the patient want to receive this vaccine today?  Yes  .   Pneumococcal Vaccine: Due for Pneumococcal vaccine. Does the patient want to receive this vaccine today?  No . Education has been provided regarding the importance of this vaccine but still declined. Advised may receive this vaccine at local pharmacy or Health Dept. Aware to provide a copy of the vaccination record if obtained from local pharmacy or Health Dept. Verbalized acceptance and understanding.   Screening Tests Health Maintenance  Topic Date Due  . PNA vac Low Risk Adult (1 of 2 - PCV13) 01/13/2009  . DEXA SCAN  09/02/2016  . TETANUS/TDAP  05/18/2017  . MAMMOGRAM  10/27/2019  . COLONOSCOPY  04/17/2020  . INFLUENZA VACCINE  Completed  . Hepatitis C Screening  Completed    Cancer Screenings:  Colorectal Screening: Completed 04/18/15. Repeat every 5 years.  Mammogram: Completed 10/2017.   Bone Density: Completed 09/03/11. Results reflect OSTEOPENIA. Repeat every 5 years. Ordered today. Pt provided with contact info and advised to call to schedule appt. Pt aware the office will call re: appt.  Lung Cancer Screening: (Low Dose CT Chest recommended if Age 31-80 years, 30 pack-year currently smoking OR have quit w/in 15years.) does not qualify.    Additional Screening:  Hepatitis C Screening: Up to date  Vision Screening: Recommended annual ophthalmology exams for early detection of glaucoma and other disorders of the eye.  Dental Screening: Recommended annual dental exams for proper oral hygiene  Community Resource Referral:  CRR required this visit?  No       Plan:  I have personally reviewed and addressed the Medicare Annual Wellness questionnaire and have noted the following in the patient's chart:  A. Medical and social history B. Use of alcohol, tobacco or illicit drugs  C. Current medications and supplements D. Functional ability and status E.  Nutritional status F.  Physical activity G. Advance directives H. List of other physicians I.    Hospitalizations, surgeries, and ER visits in previous 12 months J.  [place] such as hearing and vision if needed, cognitive and depression L. Referrals and appointments - none  In addition, I have reviewed and discussed with patient certain preventive protocols, quality metrics, and best practice recommendations. A written personalized care plan for preventive services as well as general preventive health recommendations were provided to patient.  See attached scanned questionnaire for additional information.   Signed,  Fabio Neighbors, LPN Nurse Health Advisor   Nurse Recommendations: DEXA referral sent today. Pt declined the tetanus and pneumonia vaccines today.

## 2018-04-05 NOTE — Progress Notes (Signed)
Patient: Katie Keith, Female    DOB: 04-14-1946, 72 y.o.   MRN: [id number] Visit Date: 04/05/2018  Today's Provider: Wilhemena Durie, MD   Chief Complaint  Patient presents with  . Annual Wellness Exam  . Hemorrhoids   Subjective:    Annual Physical visit     AWV   Done earlier today Katie Keith is a 72 y.o. female. She feels well. She reports exercising occasionally. She reports she is sleeping well. Her husband died earlier this year and she is coping ok with this.  Mammogram- 10/26/2017. Normal. Repeat in 1 year.  Colonoscopy- 04/18/2015. Tubular adenoma. Repeat in 5 years.  BMD-  Pap- 03/10/2017. Normal.    Hemorrhoids Patient reports that she has had trouble with hemorrhoids over the last month. She has only used warm compresses with no relief.   Review of Systems  Constitutional: Negative.   HENT: Negative.   Eyes: Negative.   Respiratory: Negative.   Cardiovascular: Negative.   Gastrointestinal: Negative.   Endocrine: Negative.   Genitourinary: Negative.   Musculoskeletal: Negative.   Skin: Negative.   Allergic/Immunologic: Negative.   Neurological: Negative.   Hematological: Negative.   Psychiatric/Behavioral: Negative.     Social History   Socioeconomic History  . Marital status: Widowed    Spouse name: Not on file  . Number of children: 3  . Years of education: Not on file  . Highest education level: Associate degree: occupational, Hotel manager, or vocational program  Occupational History  . Occupation: retired  Scientific laboratory technician  . Financial resource strain: Not hard at all  . Food insecurity:    Worry: Never true    Inability: Never true  . Transportation needs:    Medical: No    Non-medical: No  Tobacco Use  . Smoking status: Never Smoker  . Smokeless tobacco: Never Used  Substance and Sexual Activity  . Alcohol use: No  . Drug use: No  . Sexual activity: Not on file  Lifestyle  . Physical activity:    Days per week: 0  days    Minutes per session: 0 min  . Stress: To some extent  Relationships  . Social connections:    Talks on phone: Patient refused    Gets together: Patient refused    Attends religious service: Patient refused    Active member of club or organization: Patient refused    Attends meetings of clubs or organizations: Patient refused    Relationship status: Patient refused  . Intimate partner violence:    Fear of current or ex partner: Patient refused    Emotionally abused: Patient refused    Physically abused: Patient refused    Forced sexual activity: Patient refused  Other Topics Concern  . Not on file  Social History Narrative   Husband passed in 11/2017.    Past Medical History:  Diagnosis Date  . Anemia    b12 injections twice a month  . Breast cancer ([place]) 1992   LT LUMPECTOMY DCIS  . Cancer ([place]) 1992   DCIS left breast, treated with wide excision.  . Complication of anesthesia   . Diverticulosis 2006  . GERD (gastroesophageal reflux disease)   . Hypertension   . Hypothyroidism   . Migraines   . Peripheral neuropathy    feet  . PONV (postoperative nausea and vomiting)   . Thyroid disease      Patient Active Problem List   Diagnosis Date Noted  . Compression fracture  of thoracic vertebra ([place]) 03/10/2017  . Left breast mass 03/20/2016  . Hypertension 07/04/2015  . Hypothyroidism 07/04/2015  . Eustachian tube dysfunction 03/16/2015  . Encounter for screening colonoscopy 02/22/2015  . History of breast cancer 02/22/2015  . Vitamin B12 deficiency 02/16/2015  . Generalized abdominal pain 10/04/2014  . Left-sided thoracic back pain 10/04/2014    Past Surgical History:  Procedure Laterality Date  . BREAST BIOPSY Left 03/21/2016   2 areas done by byrnett fibrocystic changes  . BREAST LUMPECTOMY Left 1993   DCIS, no chemo no radation  . BREAST SURGERY Left 1992   wide excision for DCIS  . CATARACT EXTRACTION W/PHACO Left 08/04/2017   Procedure: CATARACT  EXTRACTION PHACO AND INTRAOCULAR LENS PLACEMENT (IOC);  Surgeon: Birder Robson, MD;  Location: [facility] ORS;  Service: Ophthalmology;  Laterality: Left;  [place] 00:40.7 AP% 14.6 CDE 5.97 Fluid Pack Lot # [id number]  . CATARACT EXTRACTION W/PHACO Right 08/18/2017   Procedure: CATARACT EXTRACTION PHACO AND INTRAOCULAR LENS PLACEMENT ([place]);  Surgeon: Birder Robson, MD;  Location: [facility] ORS;  Service: Ophthalmology;  Laterality: Right;  [place] 00:51.9 AP% 17.1 CDE 8.84 Fluid Pack Lot # [id number]  . COLONOSCOPY  2006   Dr Bary Castilla  . COLONOSCOPY WITH PROPOFOL N/A 04/18/2015   Procedure: COLONOSCOPY WITH PROPOFOL;  Surgeon: Robert Bellow, MD;  Location: [facility] ENDOSCOPY;  Service: Endoscopy;  Laterality: N/A;  . HERNIA REPAIR    . thumb surgery     injury with infection    Her family history includes Breast cancer in her mother and paternal grandmother; Breast cancer (age of onset: 52) in her sister; Cancer (age of onset: 45) in her sister; Diabetes in her mother and sister; Diverticulitis in her son and son; Hypertension in her sister; Lung cancer in her father; Neuropathy in her son and son.      Current Outpatient Medications:  .  acetaminophen (TYLENOL) 325 MG tablet, Take 325-975 mg by mouth every 6 (six) hours as needed for moderate pain or headache., Disp: , Rfl:  .  Artificial Tear Ointment (DRY EYES OP), Apply to eye as needed., Disp: , Rfl:  .  aspirin 81 MG tablet, Take 81 mg by mouth daily. In the evening, Disp: , Rfl:  .  Biotin 5000 MCG CAPS, Take 5,000 mcg by mouth daily. , Disp: , Rfl:  .  Calcium Carbonate-Vitamin D (CALCIUM + D PO), Take 1 tablet by mouth daily. In the morning, Disp: , Rfl:  .  carvedilol (COREG) 6.25 MG tablet, TAKE ONE TABLET BY MOUTH TWICE DAILY (Patient taking differently: TAKE 6.25 MG BY MOUTH TWICE DAILY), Disp: 60 tablet, Rfl: 12 .  CINNAMON PO, Take 1 capsule by mouth daily. In the morning, Disp: , Rfl:  .  cyanocobalamin (,VITAMIN B-12,) 1000 MCG/ML  injection, Inject 1000 mcg IM every 2 weeks (Patient taking differently: Inject 1000 mcg IM every 2 weeks), Disp: 10 mL, Rfl: 3 .  Hyoscyamine Sulfate SL (LEVSIN/SL) 0.125 MG SUBL, Place 1 tablet under the tongue 3 (three) times daily as needed. (Patient not taking: Reported on 07/23/2017), Disp: 12 each, Rfl: 0 .  ibuprofen (ADVIL,MOTRIN) 200 MG tablet, Take 200-400 mg by mouth every 6 (six) hours as needed for headache or moderate pain., Disp: , Rfl:  .  levothyroxine (SYNTHROID, LEVOTHROID) 112 MCG tablet, TAKE 1 TABLET BY MOUTH ONCE DAILY BEFORE BREAKFAST, Disp: 60 tablet, Rfl: 6 .  Melatonin 5 MG CAPS, Take by mouth at bedtime., Disp: , Rfl:  .  NEEDLE,  DISP, 25 G 25G X 1" MISC, Inject B12 twice a month, Disp: 10 each, Rfl: 12 .  Omega-3 Fatty Acids (FISH OIL) 1000 MG CAPS, Take 1,000 mg by mouth daily. In the morning, Disp: , Rfl:  .  OVER THE COUNTER MEDICATION, Take 1 capsule by mouth 2 (two) times daily. Alphoric Acid, Disp: , Rfl:  .  sodium chloride (OCEAN) 0.65 % SOLN nasal spray, Place 1 spray into both nostrils as needed for congestion., Disp: , Rfl:  .  Syringe, Disposable, 3 ML MISC, Use to inject B12 twice a month, Disp: 25 each, Rfl: 12  Patient Care Team: Jerrol Banana., MD as PCP - General (Family Medicine) Bary Castilla, Forest Gleason, MD (General Surgery) Vladimir Crofts, MD as Consulting Physician (Neurology) Carloyn Manner, MD as Referring Physician (Otolaryngology)     Objective:   Vitals: BP 102/68   Pulse 88   Temp 98.4 F (36.9 C)   Resp 16   Ht 5\' 3"  (1.6 m)   Wt 142 lb (64.4 kg)   BMI 25.15 kg/m   Physical Exam  Constitutional: She is oriented to person, place, and time. She appears well-developed and well-nourished.  HENT:  Head: Normocephalic and atraumatic.  Right Ear: External ear normal.  Left Ear: External ear normal.  Nose: Nose normal.  Mouth/Throat: Oropharynx is clear and moist.  Eyes: Pupils are equal, round, and reactive to light.  Conjunctivae and EOM are normal. Left eye exhibits no discharge.  Neck: No thyromegaly present.  Cardiovascular: Normal rate, regular rhythm, normal heart sounds and intact distal pulses.  Pulmonary/Chest: Effort normal and breath sounds normal.  Abdominal: Soft.  Musculoskeletal: She exhibits no edema.  Lymphadenopathy:    She has no cervical adenopathy.  Neurological: She is alert and oriented to person, place, and time.  Skin: Skin is warm and dry.  Psychiatric: She has a normal mood and affect. Her behavior is normal. Judgment and thought content normal.    Activities of Daily Living In your present state of health, do you have any difficulty performing the following activities: 04/05/2018  Hearing? Y  Comment Some with background noises. Does not wear hearing aids.   Vision? N  Difficulty concentrating or making decisions? N  Walking or climbing stairs? N  Dressing or bathing? N  Doing errands, shopping? N  Preparing Food and eating ? N  Using the Toilet? N  In the past six months, have you accidently leaked urine? N  Do you have problems with loss of bowel control? N  Managing your Medications? N  Managing your Finances? N  Housekeeping or managing your Housekeeping? N  Some recent data might be hidden    Fall Risk Assessment Fall Risk  04/05/2018 02/18/2017 10/19/2015 02/09/2015  Falls in the past year? No No No No     Depression Screen PHQ 2/9 Scores 04/05/2018 09/08/2017 02/18/2017 02/18/2017  PHQ - 2 Score 0 0 0 0  PHQ- 9 Score - 2 3 -    Cognitive Testing - 6-CIT 6CIT Screen 02/18/2017  What Year? 0 points  What month? 0 points  What time? 0 points  Count back from 20 0 points  Months in reverse 2 points  Repeat phrase 2 points  Total Score 4      Assessment & Plan:     Annual Wellness Visit  Reviewed patient's Family Medical History Reviewed and updated list of patient's medical providers Assessment of cognitive impairment was done Assessed patient's  functional ability Established  a written schedule for health screening [place] Completed and Reviewed  Exercise Activities and Dietary recommendations Goals    . Increase water intake     Recommend increasing water intake to 6 glasses a day.        Immunization History  Administered Date(s) Administered  . Influenza, High Dose Seasonal PF 02/18/2017, 04/05/2018  . Tdap 05/19/2007  . Zoster 07/19/2008    Health Maintenance  Topic Date Due  . PNA vac Low Risk Adult (1 of 2 - PCV13) 01/13/2009  . DEXA SCAN  09/02/2016  . TETANUS/TDAP  05/18/2017  . MAMMOGRAM  10/27/2019  . COLONOSCOPY  04/17/2020  . INFLUENZA VACCINE  Completed  . Hepatitis C Screening  Completed     Discussed health benefits of physical activity, and encouraged her to engage in regular exercise appropriate for her age and condition.   I have done the exam and reviewed the above chart and it is accurate to the best of my knowledge. Development worker, community has been used in this note in any air is in the dictation or transcription are unintentional.  Wilhemena Durie, MD  [place]

## 2018-05-24 ENCOUNTER — Ambulatory Visit
Admission: RE | Admit: 2018-05-24 | Discharge: 2018-05-24 | Disposition: A | Discharge status: Home / Self Care | Payer: PPO | Source: Ambulatory Visit | Attending: Family Medicine | Admitting: Family Medicine

## 2018-05-24 DIAGNOSIS — Z13820 Encounter for screening for osteoporosis: Secondary | ICD-10-CM | POA: Insufficient documentation

## 2018-05-24 DIAGNOSIS — M810 Age-related osteoporosis without current pathological fracture: Secondary | ICD-10-CM | POA: Insufficient documentation

## 2018-05-24 DIAGNOSIS — Z780 Asymptomatic menopausal state: Secondary | ICD-10-CM | POA: Diagnosis not present

## 2018-05-24 DIAGNOSIS — M85851 Other specified disorders of bone density and structure, right thigh: Secondary | ICD-10-CM | POA: Diagnosis not present

## 2018-05-28 ENCOUNTER — Telehealth: Payer: Self-pay

## 2018-05-28 NOTE — Telephone Encounter (Signed)
-----   Message from Jerrol Banana., MD sent at 05/27/2018  2:58 PM EST ----- Mild osteoporosis.  Try alendronate 70 mg weekly.  Turn to clinic 3 to 4 months

## 2018-05-28 NOTE — Telephone Encounter (Signed)
Unable to reach patient at home or cell, woman who answered stated that it was the wrong number.KW

## 2018-06-10 NOTE — Telephone Encounter (Signed)
Pt returned your call.  

## 2018-06-10 NOTE — Telephone Encounter (Signed)
LMTCB

## 2018-06-11 MED ORDER — ALENDRONATE SODIUM 70 MG PO TABS: 70.0000 mg | ORAL_TABLET | ORAL | 11 refills | Status: AC

## 2018-06-11 NOTE — Telephone Encounter (Signed)
After multiple attempts to reach patient and being unsuccessful will mail letter to home and send in prescription for Alendronate to pharmacy. KW

## 2018-06-21 ENCOUNTER — Other Ambulatory Visit: Payer: Self-pay | Admitting: Family Medicine

## 2018-07-22 ENCOUNTER — Other Ambulatory Visit: Payer: Self-pay | Admitting: Family Medicine

## 2018-07-23 NOTE — Telephone Encounter (Signed)
[facility] faxed refill request for the following medications:  carvedilol (COREG) 6.25 MG tablet  Qty: 180  Note from pharmacy:  Patient is requesting a 90 day supply  Please advise.

## 2018-08-08 DIAGNOSIS — E236 Other disorders of pituitary gland: Secondary | ICD-10-CM

## 2018-08-08 DIAGNOSIS — R590 Localized enlarged lymph nodes: Secondary | ICD-10-CM

## 2018-08-08 DIAGNOSIS — C439 Malignant melanoma of skin, unspecified: Secondary | ICD-10-CM

## 2018-08-08 HISTORY — DX: Localized enlarged lymph nodes: R59.0

## 2018-08-08 HISTORY — DX: Malignant melanoma of skin, unspecified: C43.9

## 2018-08-08 HISTORY — DX: Other disorders of pituitary gland: E23.6

## 2018-08-19 ENCOUNTER — Other Ambulatory Visit: Payer: Self-pay

## 2018-08-19 ENCOUNTER — Encounter: Payer: Self-pay | Admitting: Family Medicine

## 2018-08-19 ENCOUNTER — Encounter: Payer: Self-pay | Admitting: General Surgery

## 2018-08-19 ENCOUNTER — Ambulatory Visit (INDEPENDENT_AMBULATORY_CARE_PROVIDER_SITE_OTHER): Payer: PPO

## 2018-08-19 ENCOUNTER — Ambulatory Visit (INDEPENDENT_AMBULATORY_CARE_PROVIDER_SITE_OTHER): Payer: PPO | Admitting: Family Medicine

## 2018-08-19 ENCOUNTER — Ambulatory Visit (INDEPENDENT_AMBULATORY_CARE_PROVIDER_SITE_OTHER): Payer: PPO | Admitting: General Surgery

## 2018-08-19 VITALS — BP 118/64 | HR 72 | Temp 98.3°F | Resp 16 | Wt 141.0 lb

## 2018-08-19 VITALS — BP 148/87 | HR 76 | Temp 97.7°F | Resp 12 | Ht 62.0 in | Wt 141.0 lb

## 2018-08-19 DIAGNOSIS — R591 Generalized enlarged lymph nodes: Secondary | ICD-10-CM | POA: Diagnosis not present

## 2018-08-19 DIAGNOSIS — R2231 Localized swelling, mass and lump, right upper limb: Secondary | ICD-10-CM | POA: Diagnosis not present

## 2018-08-19 DIAGNOSIS — R590 Localized enlarged lymph nodes: Secondary | ICD-10-CM | POA: Diagnosis not present

## 2018-08-19 DIAGNOSIS — Z853 Personal history of malignant neoplasm of breast: Secondary | ICD-10-CM | POA: Diagnosis not present

## 2018-08-19 NOTE — Patient Instructions (Addendum)
The patient is aware to call back for any questions or new concerns. Schedule biopsy right axilla mass

## 2018-08-19 NOTE — Progress Notes (Signed)
Patient ID: Katie Keith, female   DOB: [birth year], 73 y.o.   MRN: [id number]  Chief Complaint  Patient presents with  . Mass    HPI Katie Keith is a 73 y.o. female.  Here for evaluation of right axilla mass. She noticed this last week. She states it is "sore", that is how she noticed the area.  The patient reports remaining active since her husband's death within the last few months.  Good appetite.  Excellent energy level.  No fever, chills, night sweats. HPI  Past Medical History:  Diagnosis Date  . Anemia    b12 injections twice a month  . Breast cancer ([place]) 1992   LT LUMPECTOMY DCIS  . Cancer ([place]) 1992   DCIS left breast, treated with wide excision.  . Complication of anesthesia   . Diverticulosis 2006  . GERD (gastroesophageal reflux disease)   . Hypertension   . Hypothyroidism   . Migraines   . Peripheral neuropathy    feet  . PONV (postoperative nausea and vomiting)   . Thyroid disease     Past Surgical History:  Procedure Laterality Date  . BREAST BIOPSY Left 03/21/2016   2 areas done by Brianne Maina fibrocystic changes  . BREAST LUMPECTOMY Left 1993   DCIS, no chemo no radation  . BREAST SURGERY Left 1992   wide excision for DCIS  . CATARACT EXTRACTION W/PHACO Left 08/04/2017   Procedure: CATARACT EXTRACTION PHACO AND INTRAOCULAR LENS PLACEMENT (IOC);  Surgeon: Birder Robson, MD;  Location: [facility] ORS;  Service: Ophthalmology;  Laterality: Left;  [place] 00:40.7 AP% 14.6 CDE 5.97 Fluid Pack Lot # [id number]  . CATARACT EXTRACTION W/PHACO Right 08/18/2017   Procedure: CATARACT EXTRACTION PHACO AND INTRAOCULAR LENS PLACEMENT ([place]);  Surgeon: Birder Robson, MD;  Location: [facility] ORS;  Service: Ophthalmology;  Laterality: Right;  [place] 00:51.9 AP% 17.1 CDE 8.84 Fluid Pack Lot # [id number]  . COLONOSCOPY  2006   Dr Bary Castilla  . COLONOSCOPY WITH PROPOFOL N/A 04/18/2015   Procedure: COLONOSCOPY WITH PROPOFOL;  Surgeon: Robert Bellow, MD;  Location: [facility] ENDOSCOPY;   Service: Endoscopy;  Laterality: N/A;  . HERNIA REPAIR    . thumb surgery     injury with infection    Family History  Problem Relation Age of Onset  . Breast cancer Mother        3'S  . Diabetes Mother   . Lung cancer Father   . Cancer Sister 12       breast  . Breast cancer Sister 13  . Breast cancer Paternal Grandmother        70'S  . Diverticulitis Son        part of colon removed  . Neuropathy Son   . Diabetes Sister   . Hypertension Sister   . Neuropathy Son   . Diverticulitis Son     Social History Social History   Tobacco Use  . Smoking status: Never Smoker  . Smokeless tobacco: Never Used  Substance Use Topics  . Alcohol use: No  . Drug use: No    Allergies  Allergen Reactions  . Penicillins Swelling and Other (See Comments)    Has patient had a PCN reaction causing immediate rash, facial/tongue/throat swelling, SOB or lightheadedness with hypotension: Yes Has patient had a PCN reaction causing severe rash involving mucus membranes or skin necrosis: No Has patient had a PCN reaction that required hospitalization: No Has patient had a PCN reaction occurring within the last 10 years: No If  all of the above answers are "NO", then may proceed with Cephalosporin use.   . Sulfa Antibiotics Swelling and Rash    swelling    Current Outpatient Medications  Medication Sig Dispense Refill  . acetaminophen (TYLENOL) 325 MG tablet Take 325-975 mg by mouth every 6 (six) hours as needed for moderate pain or headache.    . alendronate (FOSAMAX) 70 MG tablet Take 1 tablet (70 mg total) by mouth every 7 (seven) days. Take with a full glass of water on an empty stomach. 4 tablet 11  . Artificial Tear Ointment (DRY EYES OP) Apply to eye as needed.    Marland Kitchen aspirin 81 MG tablet Take 81 mg by mouth daily. In the evening    . Biotin 5000 MCG CAPS Take 5,000 mcg by mouth daily.     . Calcium Carbonate-Vitamin D (CALCIUM + D PO) Take 1 tablet by mouth daily. In the morning    .  carvedilol (COREG) 6.25 MG tablet TAKE 1 TABLET BY MOUTH TWICE DAILY 60 tablet 11  . CINNAMON PO Take 1 capsule by mouth daily. In the morning    . cyanocobalamin (,VITAMIN B-12,) 1000 MCG/ML injection Inject 1000 mcg IM every 2 weeks (Patient taking differently: Inject 1000 mcg IM every 2 weeks) 10 mL 3  . Hyoscyamine Sulfate SL (LEVSIN/SL) 0.125 MG SUBL Place 1 tablet under the tongue 3 (three) times daily as needed. (Patient not taking: Reported on 07/23/2017) 12 each 0  . ibuprofen (ADVIL,MOTRIN) 200 MG tablet Take 200-400 mg by mouth every 6 (six) hours as needed for headache or moderate pain.    Marland Kitchen levothyroxine (SYNTHROID, LEVOTHROID) 112 MCG tablet TAKE 1 TABLET BY MOUTH ONCE DAILY BEFORE BREAKFAST 60 tablet 6  . Melatonin 5 MG CAPS Take by mouth at bedtime.    Marland Kitchen NEEDLE, DISP, 25 G 25G X 1" MISC Inject B12 twice a month 10 each 12  . Omega-3 Fatty Acids (FISH OIL) 1000 MG CAPS Take 1,000 mg by mouth daily. In the morning    . OVER THE COUNTER MEDICATION Take 1 capsule by mouth 2 (two) times daily. Alphoric Acid    . sodium chloride (OCEAN) 0.65 % SOLN nasal spray Place 1 spray into both nostrils as needed for congestion.    . Syringe, Disposable, 3 ML MISC Use to inject B12 twice a month 25 each 12   No current facility-administered medications for this visit.     Review of Systems Review of Systems  Constitutional: Negative.  Negative for appetite change, chills, fever and unexpected weight change.  HENT: Negative.   Eyes: Negative.   Respiratory: Negative.   Cardiovascular: Negative.   Gastrointestinal: Negative.   Endocrine: Negative.   Genitourinary: Negative.   Musculoskeletal: Negative.   Allergic/Immunologic: Negative.   Neurological: Negative.   Hematological: Negative.   Psychiatric/Behavioral: Negative.     Blood pressure (!) 148/87, pulse 76, temperature 97.7 F (36.5 C), temperature source Temporal, resp. rate 12, height 5\' 2"  (1.575 m), weight 141 lb (64 kg), SpO2  96 %. Weight down one pound from annual physical on April 05, 2018.   Physical Exam Physical Exam Exam conducted with a chaperone present.  Constitutional:      Appearance: She is well-developed.  Eyes:     General: No scleral icterus.    Conjunctiva/sclera: Conjunctivae normal.  Neck:     Musculoskeletal: Neck supple.  Cardiovascular:     Rate and Rhythm: Normal rate and regular rhythm.     Heart  sounds: Normal heart sounds.  Pulmonary:     Effort: Pulmonary effort is normal.     Breath sounds: Normal breath sounds.  Chest:     Breasts:        Right: No inverted nipple, mass, nipple discharge, skin change or tenderness.        Left: No inverted nipple, mass, nipple discharge, skin change or tenderness.    Abdominal:     Palpations: There is no hepatomegaly or splenomegaly.  Lymphadenopathy:     Head:     Right side of head: No submental, submandibular, tonsillar, preauricular, posterior auricular or occipital adenopathy.     Left side of head: No submental, submandibular, tonsillar, preauricular, posterior auricular or occipital adenopathy.     Cervical: No cervical adenopathy.     Right cervical: No superficial cervical adenopathy.    Left cervical: No superficial cervical adenopathy.     Upper Body:     Right upper body: Axillary adenopathy (3 cm fullness right axilla) present. No supraclavicular adenopathy.     Left upper body: No supraclavicular adenopathy.     Lower Body: No right inguinal adenopathy. No left inguinal adenopathy.  Skin:    General: Skin is warm and dry.  Neurological:     Mental Status: She is alert and oriented to person, place, and time.  Psychiatric:        Behavior: Behavior normal.     Data Reviewed Bilateral screening mammograms dated Oct 26, 2017 reviewed.  BI-RADS-1. Ultrasound examination of the right axilla showed 1 normal-appearing lymph node but a large, round, hypoechoic area with faint posterior acoustic enhancement measuring 2.8  x 3.3 x 3.6 cm.  This is consistent with lymphadenopathy.  BI-RADS-4.  Assessment New, right axillary mass.  Plan   Schedule biopsy right axilla mass after discussion with pathology regarding tissue handling technique.   HPI, assessment, plan and physical exam has been scribed under the direction and in the presence of Robert Bellow, MD. Karie Fetch, RN  I have completed the exam and reviewed the above documentation for accuracy and completeness.  I agree with the above.  Haematologist has been used and any errors in dictation or transcription are unintentional.  Hervey Ard, M.D., F.A.C.S.  Katie Keith 08/20/2018, 7:04 AM

## 2018-08-19 NOTE — Progress Notes (Signed)
Patient: Katie Keith Female    DOB: [birth year]   73 y.o.   MRN: [id number] Visit Date: 08/19/2018  Today's Provider: Wilhemena Durie, MD   Chief Complaint  Patient presents with  . Mass    Under right arm; First noticed friday.   Subjective:     HPI   Pt comes in complaining of a tender "mass" under her right arm.  She states she first noticed it around 08/13/2018.  She reports the size remains them same.  Pt denies drainage from the mass; also denies fevers, chills, and denies breast changes.  Overall she feels well.  She states she had breast cancer in the other breast in 1993.   Allergies  Allergen Reactions  . Penicillins Swelling and Other (See Comments)    Has patient had a PCN reaction causing immediate rash, facial/tongue/throat swelling, SOB or lightheadedness with hypotension: Yes Has patient had a PCN reaction causing severe rash involving mucus membranes or skin necrosis: No Has patient had a PCN reaction that required hospitalization: No Has patient had a PCN reaction occurring within the last 10 years: No If all of the above answers are "NO", then may proceed with Cephalosporin use.   . Sulfa Antibiotics Swelling and Rash    swelling     Current Outpatient Medications:  .  acetaminophen (TYLENOL) 325 MG tablet, Take 325-975 mg by mouth every 6 (six) hours as needed for moderate pain or headache., Disp: , Rfl:  .  alendronate (FOSAMAX) 70 MG tablet, Take 1 tablet (70 mg total) by mouth every 7 (seven) days. Take with a full glass of water on an empty stomach., Disp: 4 tablet, Rfl: 11 .  Artificial Tear Ointment (DRY EYES OP), Apply to eye as needed., Disp: , Rfl:  .  aspirin 81 MG tablet, Take 81 mg by mouth daily. In the evening, Disp: , Rfl:  .  Biotin 5000 MCG CAPS, Take 5,000 mcg by mouth daily. , Disp: , Rfl:  .  carvedilol (COREG) 6.25 MG tablet, TAKE 1 TABLET BY MOUTH TWICE DAILY, Disp: 60 tablet, Rfl: 11 .  CINNAMON PO, Take 1 capsule by  mouth daily. In the morning, Disp: , Rfl:  .  cyanocobalamin (,VITAMIN B-12,) 1000 MCG/ML injection, Inject 1000 mcg IM every 2 weeks (Patient taking differently: Inject 1000 mcg IM every 2 weeks), Disp: 10 mL, Rfl: 3 .  ibuprofen (ADVIL,MOTRIN) 200 MG tablet, Take 200-400 mg by mouth every 6 (six) hours as needed for headache or moderate pain., Disp: , Rfl:  .  levothyroxine (SYNTHROID, LEVOTHROID) 112 MCG tablet, TAKE 1 TABLET BY MOUTH ONCE DAILY BEFORE BREAKFAST, Disp: 60 tablet, Rfl: 6 .  Melatonin 5 MG CAPS, Take by mouth at bedtime., Disp: , Rfl:  .  NEEDLE, DISP, 25 G 25G X 1" MISC, Inject B12 twice a month, Disp: 10 each, Rfl: 12 .  Omega-3 Fatty Acids (FISH OIL) 1000 MG CAPS, Take 1,000 mg by mouth daily. In the morning, Disp: , Rfl:  .  OVER THE COUNTER MEDICATION, Take 1 capsule by mouth 2 (two) times daily. Alphoric Acid, Disp: , Rfl:  .  sodium chloride (OCEAN) 0.65 % SOLN nasal spray, Place 1 spray into both nostrils as needed for congestion., Disp: , Rfl:  .  Syringe, Disposable, 3 ML MISC, Use to inject B12 twice a month, Disp: 25 each, Rfl: 12 .  Calcium Carbonate-Vitamin D (CALCIUM + D PO), Take 1 tablet by mouth daily.  In the morning, Disp: , Rfl:  .  Hyoscyamine Sulfate SL (LEVSIN/SL) 0.125 MG SUBL, Place 1 tablet under the tongue 3 (three) times daily as needed. (Patient not taking: Reported on 07/23/2017), Disp: 12 each, Rfl: 0  Review of Systems  Constitutional: Negative.   HENT: Negative.   Eyes: Negative.   Respiratory: Negative.   Cardiovascular: Negative.   Gastrointestinal: Negative.   Allergic/Immunologic: Negative.   Neurological: Negative.   Hematological: Positive for adenopathy (Under right arm).  Psychiatric/Behavioral: Negative.     Social History   Tobacco Use  . Smoking status: Never Smoker  . Smokeless tobacco: Never Used  Substance Use Topics  . Alcohol use: No      Objective:   BP 118/64 (BP Location: Left Arm, Patient Position: Sitting,  Cuff Size: Normal)   Pulse 72   Temp 98.3 F (36.8 C) (Oral)   Resp 16   Wt 141 lb (64 kg)   BMI 24.98 kg/m  Vitals:   08/19/18 1549  BP: 118/64  Pulse: 72  Resp: 16  Temp: 98.3 F (36.8 C)  TempSrc: Oral  Weight: 141 lb (64 kg)     Physical Exam Constitutional:      Appearance: Normal appearance.  HENT:     Head: Normocephalic and atraumatic.     Right Ear: External ear normal.     Left Ear: External ear normal.     Nose: Nose normal.  Eyes:     General: No scleral icterus.    Conjunctiva/sclera: Conjunctivae normal.  Cardiovascular:     Rate and Rhythm: Normal rate and regular rhythm.     Heart sounds: Normal heart sounds.  Pulmonary:     Effort: Pulmonary effort is normal.  Chest:     Comments: Most likely this is a lymph node.  There is a 4 cm x 3 cm mobile nontender mass in the right axilla.  No other axillary adenopathy.  No other lymph nodes on cervical chain or in groin.  Abdominal exam also benign. Abdominal:     Palpations: Abdomen is soft. There is no mass.  Lymphadenopathy:     Cervical: No cervical adenopathy.     Upper Body:     Right upper body: Axillary adenopathy present.  Neurological:     General: No focal deficit present.     Mental Status: She is alert and oriented to person, place, and time.  Psychiatric:        Mood and Affect: Mood normal.        Behavior: Behavior normal.        Thought Content: Thought content normal.        Judgment: Judgment normal.         Assessment & Plan    1. Lymphadenopathy Directed referral to Dr. Bary Castilla for possible tissue  biopsy - CBC with Differential/Platelet - Comprehensive metabolic panel - DG Chest 2 View 2.  History of breast cancer   I have done the exam and reviewed the above chart and it is accurate to the best of my knowledge. Development worker, community has been used in this note in any air is in the dictation or transcription are unintentional.  Wilhemena Durie, MD  [place]

## 2018-08-20 ENCOUNTER — Other Ambulatory Visit: Payer: Self-pay | Admitting: General Surgery

## 2018-08-20 DIAGNOSIS — R591 Generalized enlarged lymph nodes: Secondary | ICD-10-CM | POA: Diagnosis not present

## 2018-08-20 DIAGNOSIS — R590 Localized enlarged lymph nodes: Secondary | ICD-10-CM | POA: Insufficient documentation

## 2018-08-21 LAB — CBC WITH DIFFERENTIAL/PLATELET
BASOS ABS: 0 10*3/uL (ref 0.0–0.2)
BASOS: 0 %
EOS (ABSOLUTE): 0.2 10*3/uL (ref 0.0–0.4)
Eos: 3 %
HEMOGLOBIN: 12.6 g/dL (ref 11.1–15.9)
Hematocrit: 38.6 % (ref 34.0–46.6)
IMMATURE GRANS (ABS): 0 10*3/uL (ref 0.0–0.1)
Immature Granulocytes: 0 %
LYMPHS: 21 %
Lymphocytes Absolute: 1.5 10*3/uL (ref 0.7–3.1)
MCH: 27.3 pg (ref 26.6–33.0)
MCHC: 32.6 g/dL (ref 31.5–35.7)
MCV: 84 fL (ref 79–97)
MONOCYTES: 10 %
Monocytes Absolute: 0.7 10*3/uL (ref 0.1–0.9)
NEUTROS ABS: 4.6 10*3/uL (ref 1.4–7.0)
Neutrophils: 66 %
Platelets: 283 10*3/uL (ref 150–450)
RBC: 4.62 x10E6/uL (ref 3.77–5.28)
RDW: 14.9 % (ref 11.7–15.4)
WBC: 7.1 10*3/uL (ref 3.4–10.8)

## 2018-08-21 LAB — COMPREHENSIVE METABOLIC PANEL
ALBUMIN: 3.9 g/dL (ref 3.7–4.7)
ALT: 9 IU/L (ref 0–32)
AST: 16 IU/L (ref 0–40)
Albumin/Globulin Ratio: 1.3 (ref 1.2–2.2)
Alkaline Phosphatase: 79 IU/L (ref 39–117)
BILIRUBIN TOTAL: 0.5 mg/dL (ref 0.0–1.2)
BUN / CREAT RATIO: 22 (ref 12–28)
BUN: 16 mg/dL (ref 8–27)
CO2: 20 mmol/L (ref 20–29)
CREATININE: 0.73 mg/dL (ref 0.57–1.00)
Calcium: 9.8 mg/dL (ref 8.7–10.3)
Chloride: 103 mmol/L (ref 96–106)
GFR, EST AFRICAN AMERICAN: 94 mL/min/{1.73_m2} (ref >59)
GFR, EST NON AFRICAN AMERICAN: 81 mL/min/{1.73_m2} (ref >59)
Globulin, Total: 3 g/dL (ref 1.5–4.5)
Glucose: 90 mg/dL (ref 65–99)
Potassium: 4.5 mmol/L (ref 3.5–5.2)
Sodium: 139 mmol/L (ref 134–144)
TOTAL PROTEIN: 6.9 g/dL (ref 6.0–8.5)

## 2018-08-23 ENCOUNTER — Telehealth: Payer: Self-pay

## 2018-08-23 NOTE — Telephone Encounter (Signed)
The patient has been notified to arrive at the hospital on 08/24/18 at 10 am and report to the Registration desk in the [facility]. She will only take her Levothyroxine and Carvedilol in the morning with water. She will stop the use of Advil, Melatonin, and Fish Oil. She is aware of date, time, and instructions.

## 2018-08-24 ENCOUNTER — Ambulatory Visit: Payer: PPO | Admitting: Registered Nurse

## 2018-08-24 ENCOUNTER — Other Ambulatory Visit: Payer: Self-pay

## 2018-08-24 ENCOUNTER — Encounter
Admission: RE | Disposition: A | Discharge status: Home / Self Care | Payer: Self-pay | Source: Home / Self Care | Attending: General Surgery

## 2018-08-24 ENCOUNTER — Encounter: Payer: Self-pay | Admitting: *Deleted

## 2018-08-24 ENCOUNTER — Ambulatory Visit
Admission: RE | Admit: 2018-08-24 | Discharge: 2018-08-24 | Disposition: A | Discharge status: Home / Self Care | Payer: PPO | Attending: General Surgery | Admitting: General Surgery

## 2018-08-24 DIAGNOSIS — C439 Malignant melanoma of skin, unspecified: Secondary | ICD-10-CM | POA: Diagnosis not present

## 2018-08-24 DIAGNOSIS — Z79890 Hormone replacement therapy (postmenopausal): Secondary | ICD-10-CM | POA: Diagnosis not present

## 2018-08-24 DIAGNOSIS — R591 Generalized enlarged lymph nodes: Secondary | ICD-10-CM | POA: Diagnosis not present

## 2018-08-24 DIAGNOSIS — Z880 Allergy status to penicillin: Secondary | ICD-10-CM | POA: Diagnosis not present

## 2018-08-24 DIAGNOSIS — Z7983 Long term (current) use of bisphosphonates: Secondary | ICD-10-CM | POA: Diagnosis not present

## 2018-08-24 DIAGNOSIS — Z86000 Personal history of in-situ neoplasm of breast: Secondary | ICD-10-CM | POA: Insufficient documentation

## 2018-08-24 DIAGNOSIS — E039 Hypothyroidism, unspecified: Secondary | ICD-10-CM | POA: Insufficient documentation

## 2018-08-24 DIAGNOSIS — C773 Secondary and unspecified malignant neoplasm of axilla and upper limb lymph nodes: Secondary | ICD-10-CM | POA: Insufficient documentation

## 2018-08-24 DIAGNOSIS — Z882 Allergy status to sulfonamides status: Secondary | ICD-10-CM | POA: Insufficient documentation

## 2018-08-24 DIAGNOSIS — Z79899 Other long term (current) drug therapy: Secondary | ICD-10-CM | POA: Insufficient documentation

## 2018-08-24 DIAGNOSIS — R590 Localized enlarged lymph nodes: Secondary | ICD-10-CM | POA: Diagnosis not present

## 2018-08-24 DIAGNOSIS — I10 Essential (primary) hypertension: Secondary | ICD-10-CM | POA: Insufficient documentation

## 2018-08-24 DIAGNOSIS — Z7982 Long term (current) use of aspirin: Secondary | ICD-10-CM | POA: Diagnosis not present

## 2018-08-24 HISTORY — PX: AXILLARY LYMPH NODE BIOPSY: SHX5737

## 2018-08-24 SURGERY — AXILLARY LYMPH NODE BIOPSY
Anesthesia: General | Laterality: Right

## 2018-08-24 SURGERY — AXILLARY LYMPH NODE BIOPSY
Anesthesia: General | Site: Axilla | Laterality: Right

## 2018-08-24 MED ORDER — LACTATED RINGERS IV SOLN
INTRAVENOUS | Status: DC
  Administered 2018-08-24: 11:00:00 via INTRAVENOUS

## 2018-08-24 MED ORDER — FAMOTIDINE 20 MG PO TABS
ORAL_TABLET | ORAL | Status: AC
  Administered 2018-08-24: 20 mg via ORAL
  Filled 2018-08-24: qty 1

## 2018-08-24 MED ORDER — PROMETHAZINE HCL 25 MG/ML IJ SOLN: 6.2500 mg | INTRAMUSCULAR | Status: DC | PRN

## 2018-08-24 MED ORDER — ONDANSETRON HCL 4 MG/2ML IJ SOLN
INTRAMUSCULAR | Status: DC | PRN
  Administered 2018-08-24: 4 mg via INTRAVENOUS

## 2018-08-24 MED ORDER — DEXAMETHASONE SODIUM PHOSPHATE 10 MG/ML IJ SOLN
INTRAMUSCULAR | Status: DC | PRN
  Administered 2018-08-24: 10 mg via INTRAVENOUS

## 2018-08-24 MED ORDER — ACETAMINOPHEN 10 MG/ML IV SOLN
INTRAVENOUS | Status: DC | PRN
  Administered 2018-08-24: 1000 mg via INTRAVENOUS

## 2018-08-24 MED ORDER — MEPERIDINE HCL 50 MG/ML IJ SOLN: 6.2500 mg | INTRAMUSCULAR | Status: DC | PRN

## 2018-08-24 MED ORDER — PROPOFOL 10 MG/ML IV BOLUS
INTRAVENOUS | Status: AC
  Filled 2018-08-24: qty 20

## 2018-08-24 MED ORDER — FENTANYL CITRATE (PF) 100 MCG/2ML IJ SOLN
INTRAMUSCULAR | Status: DC | PRN
  Administered 2018-08-24: 50 ug via INTRAVENOUS
  Administered 2018-08-24: 25 ug via INTRAVENOUS

## 2018-08-24 MED ORDER — OXYCODONE HCL 5 MG PO TABS
5.0000 mg | ORAL_TABLET | Freq: Once | ORAL | Status: AC | PRN
  Administered 2018-08-24: 5 mg via ORAL

## 2018-08-24 MED ORDER — GLYCOPYRROLATE 0.2 MG/ML IJ SOLN
INTRAMUSCULAR | Status: DC | PRN
  Administered 2018-08-24: 0.2 mg via INTRAVENOUS

## 2018-08-24 MED ORDER — GLYCOPYRROLATE 0.2 MG/ML IJ SOLN
INTRAMUSCULAR | Status: AC
  Filled 2018-08-24: qty 1

## 2018-08-24 MED ORDER — BUPIVACAINE-EPINEPHRINE (PF) 0.5% -1:200000 IJ SOLN
INTRAMUSCULAR | Status: AC
  Filled 2018-08-24: qty 30

## 2018-08-24 MED ORDER — OXYCODONE HCL 5 MG/5ML PO SOLN: 5.0000 mg | Freq: Once | ORAL | Status: AC | PRN

## 2018-08-24 MED ORDER — PROPOFOL 10 MG/ML IV BOLUS
INTRAVENOUS | Status: DC | PRN
  Administered 2018-08-24 (×2): 20 mg via INTRAVENOUS
  Administered 2018-08-24: 60 mg via INTRAVENOUS

## 2018-08-24 MED ORDER — DEXAMETHASONE SODIUM PHOSPHATE 10 MG/ML IJ SOLN
INTRAMUSCULAR | Status: AC
  Filled 2018-08-24: qty 1

## 2018-08-24 MED ORDER — FAMOTIDINE 20 MG PO TABS
20.0000 mg | ORAL_TABLET | Freq: Once | ORAL | Status: AC
  Administered 2018-08-24: 20 mg via ORAL

## 2018-08-24 MED ORDER — BUPIVACAINE-EPINEPHRINE 0.5% -1:200000 IJ SOLN
INTRAMUSCULAR | Status: DC | PRN
  Administered 2018-08-24: 30 mL

## 2018-08-24 MED ORDER — LIDOCAINE HCL (CARDIAC) PF 100 MG/5ML IV SOSY
PREFILLED_SYRINGE | INTRAVENOUS | Status: DC | PRN
  Administered 2018-08-24: 80 mg via INTRAVENOUS

## 2018-08-24 MED ORDER — FENTANYL CITRATE (PF) 100 MCG/2ML IJ SOLN
INTRAMUSCULAR | Status: AC
  Filled 2018-08-24: qty 2

## 2018-08-24 MED ORDER — ACETAMINOPHEN 10 MG/ML IV SOLN
INTRAVENOUS | Status: AC
  Filled 2018-08-24: qty 100

## 2018-08-24 MED ORDER — PROPOFOL 500 MG/50ML IV EMUL
INTRAVENOUS | Status: DC | PRN
  Administered 2018-08-24: 25 ug/kg/min via INTRAVENOUS

## 2018-08-24 MED ORDER — ONDANSETRON HCL 4 MG/2ML IJ SOLN
INTRAMUSCULAR | Status: AC
  Filled 2018-08-24: qty 2

## 2018-08-24 MED ORDER — FENTANYL CITRATE (PF) 100 MCG/2ML IJ SOLN: 25.0000 ug | INTRAMUSCULAR | Status: DC | PRN

## 2018-08-24 MED ORDER — PHENYLEPHRINE HCL 10 MG/ML IJ SOLN
INTRAMUSCULAR | Status: DC | PRN
  Administered 2018-08-24 (×3): 50 ug via INTRAVENOUS

## 2018-08-24 MED ORDER — HYDROCODONE-ACETAMINOPHEN 5-325 MG PO TABS: 1.0000 | ORAL_TABLET | ORAL | 0 refills | Status: DC | PRN

## 2018-08-24 MED ORDER — OXYCODONE HCL 5 MG PO TABS
ORAL_TABLET | ORAL | Status: AC
  Filled 2018-08-24: qty 1

## 2018-08-24 MED ORDER — LIDOCAINE HCL (PF) 2 % IJ SOLN
INTRAMUSCULAR | Status: AC
  Filled 2018-08-24: qty 10

## 2018-08-24 SURGICAL SUPPLY — 37 items
APL PRP STRL LF DISP 70% ISPRP (MISCELLANEOUS) ×1
APPLIER CLIP 11 MED OPEN (CLIP)
APR CLP MED 11 20 MLT OPN (CLIP)
BLADE SURG 15 STRL SS SAFETY (BLADE) ×3 IMPLANT
CANISTER SUCT 1200ML W/VALVE (MISCELLANEOUS) ×3 IMPLANT
CHLORAPREP W/TINT 26 (MISCELLANEOUS) ×3 IMPLANT
CLIP APPLIE 11 MED OPEN (CLIP) IMPLANT
CLOSURE WOUND 1/2 X4 (GAUZE/BANDAGES/DRESSINGS) ×1
CNTNR SPEC 2.5X3XGRAD LEK (MISCELLANEOUS) ×1
CONT SPEC 4OZ STER OR WHT (MISCELLANEOUS) ×2
CONT SPEC 4OZ STRL OR WHT (MISCELLANEOUS) ×1
CONTAINER SPEC 2.5X3XGRAD LEK (MISCELLANEOUS) IMPLANT
COVER WAND RF STERILE (DRAPES) ×3 IMPLANT
DRAPE LAPAROTOMY TRNSV 106X77 (MISCELLANEOUS) ×3 IMPLANT
DRSG TEGADERM 4X4.75 (GAUZE/BANDAGES/DRESSINGS) ×3 IMPLANT
DRSG TELFA 4X3 1S NADH ST (GAUZE/BANDAGES/DRESSINGS) ×3 IMPLANT
ELECT REM PT RETURN 9FT ADLT (ELECTROSURGICAL) ×3
ELECTRODE REM PT RTRN 9FT ADLT (ELECTROSURGICAL) ×1 IMPLANT
GLOVE BIO SURGEON STRL SZ7.5 (GLOVE) ×5 IMPLANT
GLOVE INDICATOR 8.0 STRL GRN (GLOVE) ×5 IMPLANT
GOWN STRL REUS W/ TWL LRG LVL3 (GOWN DISPOSABLE) ×2 IMPLANT
GOWN STRL REUS W/TWL LRG LVL3 (GOWN DISPOSABLE) ×6
KIT TURNOVER KIT A (KITS) ×3 IMPLANT
LABEL OR SOLS (LABEL) ×3 IMPLANT
NDL HYPO 25X1 1.5 SAFETY (NEEDLE) ×1 IMPLANT
NEEDLE HYPO 25X1 1.5 SAFETY (NEEDLE) ×6 IMPLANT
NS IRRIG 500ML POUR BTL (IV SOLUTION) ×3 IMPLANT
PACK BASIN MINOR ARMC (MISCELLANEOUS) ×3 IMPLANT
SHEARS FOC LG CVD HARMONIC 17C (MISCELLANEOUS) ×1 IMPLANT
STRIP CLOSURE SKIN 1/2X4 (GAUZE/BANDAGES/DRESSINGS) ×2 IMPLANT
SUT VIC AB 2-0 CT1 27 (SUTURE) ×3
SUT VIC AB 2-0 CT1 TAPERPNT 27 (SUTURE) ×1 IMPLANT
SUT VIC AB 3-0 54X BRD REEL (SUTURE) ×1 IMPLANT
SUT VIC AB 3-0 BRD 54 (SUTURE) ×3
SUT VIC AB 4-0 PS2 18 (SUTURE) ×3 IMPLANT
SWABSTK COMLB BENZOIN TINCTURE (MISCELLANEOUS) ×3 IMPLANT
SYR 10ML LL (SYRINGE) ×3 IMPLANT

## 2018-08-24 NOTE — Discharge Instructions (Signed)
  AMBULATORY SURGERY  DISCHARGE INSTRUCTIONS   1) The drugs that you were given will stay in your system until tomorrow so for the next 24 hours you should not:  A) Drive an automobile B) Make any legal decisions C) Drink any alcoholic beverage   2) You may resume regular meals tomorrow.  Today it is better to start with liquids and gradually work up to solid foods.  You may eat anything you prefer, but it is better to start with liquids, then soup and crackers, and gradually work up to solid foods.   3) Please notify your doctor immediately if you have any unusual bleeding, trouble breathing, redness and pain at the surgery site, drainage, fever, or pain not relieved by medication.    4) Additional Instructions: TAKE A STOOL SOFTENER TWICE A DAY WHILE TAKING NARCOTIC PAIN MEDICINE TO PREVENT CONSTIPATION   Please contact your physician with any problems or Same Day Surgery at [phone], Monday through Friday 6 am to 4 pm, or [place] at [place] Main number at [phone].   

## 2018-08-24 NOTE — Transfer of Care (Signed)
Immediate Anesthesia Transfer of Care Note  Patient: Katie Keith  Procedure(s) Performed: AXILLARY LYMPH NODE BIOPSY (Right Axilla)  Patient Location: PACU  Anesthesia Type:General  Level of Consciousness: drowsy  Airway & Oxygen Therapy: Patient Spontanous Breathing and Patient connected to face mask oxygen  Post-op Assessment: Report given to RN and Post -op Vital signs reviewed and stable  Post vital signs: Reviewed and stable  Last Vitals:  Vitals Value Taken Time  BP 145/85 08/24/2018  1:24 PM  Temp 36.4 C 08/24/2018  1:24 PM  Pulse 81 08/24/2018  1:29 PM  Resp 11 08/24/2018  1:29 PM  SpO2 100 % 08/24/2018  1:29 PM  Vitals shown include unvalidated device data.  Last Pain:  Vitals:   08/24/18 1324  PainSc: Asleep         Complications: No apparent anesthesia complications

## 2018-08-24 NOTE — Op Note (Signed)
Preoperative diagnosis: Right axillary lymphadenopathy.  Postoperative diagnosis: Same.  Operative procedure: Excision right axillary lymph nodes.  Operating Surgeon: Hervey Ard, MD  Anesthesia: General by LMA, Marcaine 0.5% with 1 200,000 units of epinephrine.  The cc.  Estimated blood loss: 30 cc.  Clinical note: This 73 year old woman presented last week with a one-week history of a right axillary mass.  Ultrasound suggested enlarged lymph node.  She is admitted for planned excision.  Operative note: The patient underwent general anesthesia without difficulty.  A small roll was placed behind the right shoulder.  The right axilla and chest was cleansed with ChloraPrep and draped.  Marcaine was infiltrated for postoperative analgesia.  A transverse incision was made over the mass.  The skin was incised sharply and remaining dissection completed with electrocautery.  A large multilobulated mass with suggestion of internal hemorrhage was encountered.  This was dissected from the adjacent adipose tissue with electrocautery.  Hemostasis was with 3-0 Vicryl ties.  It appeared that several additional lymph nodes were evident.  At least 2 sizable nodes were sent for pathology review.  After assuring good hemostasis the wound was closed in layers with interrupted 2-0 Vicryl figure-of-eight sutures in 2 layers.  The skin was then closed with a running 4-0 Vicryl subcuticular suture.  Benzoin, Steri-Strips, Telfa and Tegaderm dressing applied.  The patient tolerated the procedure well and was taken to recovery in stable condition

## 2018-08-24 NOTE — Anesthesia Procedure Notes (Addendum)
Procedure Name: LMA Insertion Date/Time: 08/24/2018 12:31 PM Performed by: Rudean Hitt, CRNA Pre-anesthesia Checklist: Patient identified, Patient being monitored, Timeout performed, Emergency Drugs available and Suction available Patient Re-evaluated:Patient Re-evaluated prior to induction Oxygen Delivery Method: Circle system utilized Preoxygenation: Pre-oxygenation with 100% oxygen Induction Type: IV induction Ventilation: Mask ventilation without difficulty LMA: LMA inserted LMA Size: 3.5 Tube type: Oral Number of attempts: 1 Placement Confirmation: positive ETCO2 and breath sounds checked- equal and bilateral Tube secured with: Tape Dental Injury: Teeth and Oropharynx as per pre-operative assessment

## 2018-08-24 NOTE — Anesthesia Preprocedure Evaluation (Signed)
Anesthesia Evaluation  Patient identified by MRN, date of birth, ID band Patient awake    Reviewed: Allergy & Precautions, NPO status , Patient's Chart, lab work & pertinent test results  History of Anesthesia Complications (+) PONV and history of anesthetic complications  Airway Mallampati: II  TM Distance: >3 FB Neck ROM: Full    Dental  (+) Partial Upper   Pulmonary neg pulmonary ROS, neg sleep apnea, neg COPD,    breath sounds clear to auscultation- rhonchi (-) wheezing      Cardiovascular hypertension, Pt. on medications (-) CAD, (-) Past MI, (-) Cardiac Stents and (-) CABG  Rhythm:Regular Rate:Normal - Systolic murmurs and - Diastolic murmurs    Neuro/Psych  Headaches, neg Seizures negative psych ROS   GI/Hepatic Neg liver ROS, GERD  ,  Endo/Other  neg diabetesHypothyroidism   Renal/GU negative Renal ROS     Musculoskeletal negative musculoskeletal ROS (+)   Abdominal (+) - obese,   Peds  Hematology  (+) anemia ,   Anesthesia Other Findings Past Medical History: No date: Anemia     Comment:  b12 injections twice a month 1992: Breast cancer ([place])     Comment:  LT LUMPECTOMY DCIS 1992: Cancer ([place])     Comment:  DCIS left breast, treated with wide excision. No date: Complication of anesthesia 2006: Diverticulosis No date: GERD (gastroesophageal reflux disease) No date: Hypertension No date: Hypothyroidism No date: Migraines No date: Peripheral neuropathy     Comment:  feet No date: PONV (postoperative nausea and vomiting) No date: Thyroid disease   Reproductive/Obstetrics                             Anesthesia Physical Anesthesia Plan  ASA: II  Anesthesia Plan: General   Post-op Pain Management:    Induction: Intravenous  PONV Risk Score and Plan: 3 and Ondansetron, Dexamethasone, Propofol infusion and Midazolam  Airway Management Planned: LMA  Additional  Equipment:   Intra-op Plan:   Post-operative Plan:   Informed Consent: I have reviewed the patients History and Physical, chart, labs and discussed the procedure including the risks, benefits and alternatives for the proposed anesthesia with the patient or authorized representative who has indicated his/her understanding and acceptance.     Dental advisory given  Plan Discussed with: CRNA and Anesthesiologist  Anesthesia Plan Comments:         Anesthesia Quick Evaluation

## 2018-08-24 NOTE — H&P (Signed)
No change in clinical exam or history.   For right axillary node biopsy.

## 2018-08-24 NOTE — Anesthesia Post-op Follow-up Note (Signed)
Anesthesia QCDR form completed.        

## 2018-08-25 ENCOUNTER — Encounter: Payer: Self-pay | Admitting: General Surgery

## 2018-08-25 NOTE — Anesthesia Postprocedure Evaluation (Signed)
Anesthesia Post Note  Patient: Katie Keith  Procedure(s) Performed: AXILLARY LYMPH NODE BIOPSY (Right Axilla)  Patient location during evaluation: PACU Anesthesia Type: General Level of consciousness: awake and alert and oriented Pain management: pain level controlled Vital Signs Assessment: post-procedure vital signs reviewed and stable Respiratory status: spontaneous breathing, nonlabored ventilation and respiratory function stable Cardiovascular status: blood pressure returned to baseline and stable Postop Assessment: no signs of nausea or vomiting Anesthetic complications: no     Last Vitals:  Vitals:   08/24/18 1456 08/24/18 1522  BP: (!) 174/95 (!) 166/89  Pulse: 73 76  Resp: 16 15  Temp: 36.8 C   SpO2: 94% 95%    Last Pain:  Vitals:   08/24/18 1522  PainSc: 2                  Jordana Dugue

## 2018-08-27 ENCOUNTER — Other Ambulatory Visit: Payer: Self-pay

## 2018-08-27 ENCOUNTER — Other Ambulatory Visit: Payer: Self-pay | Admitting: Anatomic Pathology & Clinical Pathology

## 2018-08-27 ENCOUNTER — Telehealth: Payer: Self-pay | Admitting: General Surgery

## 2018-08-27 DIAGNOSIS — C799 Secondary malignant neoplasm of unspecified site: Secondary | ICD-10-CM

## 2018-08-27 DIAGNOSIS — C439 Malignant melanoma of skin, unspecified: Secondary | ICD-10-CM

## 2018-08-27 LAB — SURGICAL PATHOLOGY

## 2018-08-27 NOTE — Telephone Encounter (Signed)
Notified that the pathology showed metastatic malignant melanoma.  Reports minimal pain.  Case to be presented at [facility] tumor board on March 26,2020.

## 2018-08-30 ENCOUNTER — Other Ambulatory Visit
Admission: RE | Admit: 2018-08-30 | Discharge: 2018-08-30 | Disposition: A | Discharge status: Home / Self Care | Payer: PPO | Source: Ambulatory Visit | Attending: General Surgery | Admitting: General Surgery

## 2018-08-30 ENCOUNTER — Other Ambulatory Visit: Payer: Self-pay

## 2018-08-30 ENCOUNTER — Telehealth: Payer: Self-pay

## 2018-08-30 DIAGNOSIS — C799 Secondary malignant neoplasm of unspecified site: Secondary | ICD-10-CM | POA: Diagnosis not present

## 2018-08-30 DIAGNOSIS — C439 Malignant melanoma of skin, unspecified: Secondary | ICD-10-CM

## 2018-08-30 LAB — LACTATE DEHYDROGENASE: LDH: 103 U/L (ref 98–192)

## 2018-08-30 NOTE — Telephone Encounter (Signed)
Message left for patient to call back about rescheduling imaging to a different location and date.

## 2018-08-30 NOTE — Telephone Encounter (Signed)
The patient is scheduled for a CT of the Chest, abdomen, and pelvis with contrast and also a MRI of the Brain w & w/o contrast at [facility] on 08/31/18 at 10:00 am. She will arrive there by 9:00 am and have nothing to eat or drink for 4-6 hours prior. She may take her heart and thyroid medication with a small sip of water that morning.  Imaging scheduled at [place] has been canceled.  The patient is aware of date, time, and instructions.

## 2018-08-31 ENCOUNTER — Ambulatory Visit (INDEPENDENT_AMBULATORY_CARE_PROVIDER_SITE_OTHER): Payer: PPO | Admitting: General Surgery

## 2018-08-31 ENCOUNTER — Other Ambulatory Visit: Payer: Self-pay

## 2018-08-31 ENCOUNTER — Ambulatory Visit: Payer: Self-pay

## 2018-08-31 ENCOUNTER — Encounter: Payer: Self-pay | Admitting: General Surgery

## 2018-08-31 VITALS — BP 153/98 | HR 80 | Temp 97.7°F | Resp 12 | Wt 138.5 lb

## 2018-08-31 DIAGNOSIS — E237 Disorder of pituitary gland, unspecified: Secondary | ICD-10-CM | POA: Diagnosis not present

## 2018-08-31 DIAGNOSIS — E236 Other disorders of pituitary gland: Secondary | ICD-10-CM | POA: Diagnosis not present

## 2018-08-31 DIAGNOSIS — N134 Hydroureter: Secondary | ICD-10-CM | POA: Diagnosis not present

## 2018-08-31 DIAGNOSIS — K5732 Diverticulitis of large intestine without perforation or abscess without bleeding: Secondary | ICD-10-CM | POA: Diagnosis not present

## 2018-08-31 DIAGNOSIS — C439 Malignant melanoma of skin, unspecified: Secondary | ICD-10-CM

## 2018-08-31 DIAGNOSIS — C799 Secondary malignant neoplasm of unspecified site: Secondary | ICD-10-CM

## 2018-08-31 DIAGNOSIS — R590 Localized enlarged lymph nodes: Secondary | ICD-10-CM | POA: Diagnosis not present

## 2018-08-31 DIAGNOSIS — N838 Other noninflammatory disorders of ovary, fallopian tube and broad ligament: Secondary | ICD-10-CM | POA: Diagnosis not present

## 2018-08-31 DIAGNOSIS — M4804 Spinal stenosis, thoracic region: Secondary | ICD-10-CM | POA: Diagnosis not present

## 2018-08-31 DIAGNOSIS — M4854XA Collapsed vertebra, not elsewhere classified, thoracic region, initial encounter for fracture: Secondary | ICD-10-CM | POA: Diagnosis not present

## 2018-08-31 DIAGNOSIS — R911 Solitary pulmonary nodule: Secondary | ICD-10-CM | POA: Diagnosis not present

## 2018-08-31 MED ORDER — CIPROFLOXACIN HCL 500 MG PO TABS: 500.0000 mg | ORAL_TABLET | Freq: Two times a day (BID) | ORAL | 0 refills | Status: AC

## 2018-08-31 MED ORDER — METRONIDAZOLE 500 MG PO TABS: 500.0000 mg | ORAL_TABLET | Freq: Three times a day (TID) | ORAL | 0 refills | Status: AC

## 2018-08-31 NOTE — Patient Instructions (Addendum)
You can use Abreva for you sore on your lip. You may use heat to your surgical area for comfort.

## 2018-08-31 NOTE — Progress Notes (Signed)
Patient ID: Katie Keith, female   DOB: 1945-12-16, 73 y.o.   MRN: [id number]  Chief Complaint  Patient presents with  . Routine Post Op    HPI Katie Keith is a 73 y.o. female here to discuss results from recent imaging and for a post op from right axillary biopsy done 08/24/18. She is here today with her daughter, Katie Keith. She reports that she is doing well. She does report some stomach pain since her surgery on 08/24/18. She states that she has had diarrhea.  She reports some rectal bleeding.  HPI  Past Medical History:  Diagnosis Date  . Anemia    b12 injections twice a month  . Breast cancer ([place]) 1992   LT LUMPECTOMY DCIS  . Cancer ([place]) 1992   DCIS left breast, treated with wide excision.  . Complication of anesthesia   . Diverticulosis 2006  . GERD (gastroesophageal reflux disease)   . Hypertension   . Hypothyroidism   . Migraines   . Peripheral neuropathy    feet  . PONV (postoperative nausea and vomiting)   . Thyroid disease     Past Surgical History:  Procedure Laterality Date  . AXILLARY LYMPH NODE BIOPSY Right 08/24/2018   Procedure: AXILLARY LYMPH NODE BIOPSY;  Surgeon: Robert Bellow, MD;  Location: [facility] ORS;  Service: General;  Laterality: Right;  . BREAST BIOPSY Left 03/21/2016   2 areas done by Ellisyn Icenhower fibrocystic changes  . BREAST LUMPECTOMY Left 1993   DCIS, no chemo no radation  . BREAST SURGERY Left 1992   wide excision for DCIS  . CATARACT EXTRACTION W/PHACO Left 08/04/2017   Procedure: CATARACT EXTRACTION PHACO AND INTRAOCULAR LENS PLACEMENT (IOC);  Surgeon: Birder Robson, MD;  Location: [facility] ORS;  Service: Ophthalmology;  Laterality: Left;  [place] 00:40.7 AP% 14.6 CDE 5.97 Fluid Pack Lot # [id number]  . CATARACT EXTRACTION W/PHACO Right 08/18/2017   Procedure: CATARACT EXTRACTION PHACO AND INTRAOCULAR LENS PLACEMENT ([place]);  Surgeon: Birder Robson, MD;  Location: [facility] ORS;  Service: Ophthalmology;  Laterality: Right;  [place] 00:51.9 AP%  17.1 CDE 8.84 Fluid Pack Lot # [id number]  . COLONOSCOPY  2006   Dr Bary Castilla  . COLONOSCOPY WITH PROPOFOL N/A 04/18/2015   Procedure: COLONOSCOPY WITH PROPOFOL;  Surgeon: Robert Bellow, MD;  Location: [facility] ENDOSCOPY;  Service: Endoscopy;  Laterality: N/A;  . HERNIA REPAIR    . thumb surgery     injury with infection    Family History  Problem Relation Age of Onset  . Breast cancer Mother        47'S  . Diabetes Mother   . Lung cancer Father   . Cancer Sister 75       breast  . Breast cancer Sister 65  . Breast cancer Paternal Grandmother        70'S  . Diverticulitis Son        part of colon removed  . Neuropathy Son   . Diabetes Sister   . Hypertension Sister   . Neuropathy Son   . Diverticulitis Son     Social History Social History   Tobacco Use  . Smoking status: Never Smoker  . Smokeless tobacco: Never Used  Substance Use Topics  . Alcohol use: No  . Drug use: No    Allergies  Allergen Reactions  . Penicillins Swelling and Other (See Comments)    Has patient had a PCN reaction causing immediate rash, facial/tongue/throat swelling, SOB or lightheadedness with hypotension: Yes Has patient  had a PCN reaction causing severe rash involving mucus membranes or skin necrosis: No Has patient had a PCN reaction that required hospitalization: No Has patient had a PCN reaction occurring within the last 10 years: No If all of the above answers are "NO", then may proceed with Cephalosporin use.   . Sulfa Antibiotics Swelling and Rash    swelling    Current Outpatient Medications  Medication Sig Dispense Refill  . acetaminophen (TYLENOL) 325 MG tablet Take 325-975 mg by mouth every 6 (six) hours as needed for moderate pain or headache.    . alendronate (FOSAMAX) 70 MG tablet Take 1 tablet (70 mg total) by mouth every 7 (seven) days. Take with a full glass of water on an empty stomach. 4 tablet 11  . Artificial Tear Ointment (DRY EYES OP) Apply 1 drop to eye as needed  (dry eyes).     Marland Kitchen aspirin 81 MG tablet Take 81 mg by mouth daily. In the evening    . Biotin 5000 MCG CAPS Take 5,000 mcg by mouth daily.     . Calcium Carbonate-Vitamin D (CALCIUM + D PO) Take 1 tablet by mouth daily. In the morning    . carvedilol (COREG) 6.25 MG tablet TAKE 1 TABLET BY MOUTH TWICE DAILY 60 tablet 11  . CINNAMON PO Take 1 capsule by mouth daily. In the morning    . cyanocobalamin (,VITAMIN B-12,) 1000 MCG/ML injection Inject 1000 mcg IM every 2 weeks (Patient taking differently: Inject 1,000 mcg into the muscle every 14 (fourteen) days. Inject 1000 mcg IM every 2 weeks) 10 mL 3  . ibuprofen (ADVIL,MOTRIN) 200 MG tablet Take 200-400 mg by mouth every 6 (six) hours as needed for headache or moderate pain.    Marland Kitchen levothyroxine (SYNTHROID, LEVOTHROID) 112 MCG tablet TAKE 1 TABLET BY MOUTH ONCE DAILY BEFORE BREAKFAST (Patient taking differently: Take 112 mcg by mouth daily before breakfast. ) 60 tablet 6  . NEEDLE, DISP, 25 G 25G X 1" MISC Inject B12 twice a month 10 each 12  . Omega-3 Fatty Acids (FISH OIL) 1000 MG CAPS Take 1,000 mg by mouth daily. In the morning    . OVER THE COUNTER MEDICATION Take 1 capsule by mouth 2 (two) times daily. Alphoric Acid    . sodium chloride (OCEAN) 0.65 % SOLN nasal spray Place 1 spray into both nostrils as needed for congestion.    . Syringe, Disposable, 3 ML MISC Use to inject B12 twice a month 25 each 12  . ciprofloxacin (CIPRO) 500 MG tablet Take 1 tablet (500 mg total) by mouth 2 (two) times daily for 7 days. 14 tablet 0  . metroNIDAZOLE (FLAGYL) 500 MG tablet Take 1 tablet (500 mg total) by mouth 3 (three) times daily for 7 days. 21 tablet 0   No current facility-administered medications for this visit.     Review of Systems Review of Systems  Constitutional: Negative.   Respiratory: Negative.   Cardiovascular: Negative.   Gastrointestinal: Positive for abdominal pain and diarrhea.       Symptoms of left> right lower quadrant pain began  5 days ago.  Not impacting activity, but little improvement. Similar to past episodes of diverticulitis.      Blood pressure (!) 153/98, pulse 80, temperature 97.7 F (36.5 C), resp. rate 12, weight 138 lb 8 oz (62.8 kg), SpO2 97 %.  Physical Exam Physical Exam Constitutional:      Appearance: She is well-developed.  Eyes:     General:  No scleral icterus.    Conjunctiva/sclera: Conjunctivae normal.  Neck:     Musculoskeletal: Neck supple.  Abdominal:     Tenderness: There is abdominal tenderness.    Genitourinary:    Rectum: Internal hemorrhoid present.  Lymphadenopathy:     Cervical: No cervical adenopathy.     Upper Body:     Right upper body: No supraclavicular adenopathy.     Left upper body: No supraclavicular or axillary adenopathy.     Lower Body: No right inguinal adenopathy. No left inguinal adenopathy.     Comments: The incision in the right axilla is healing well, there is evidence of a 4 cm seroma.  No tenderness.  No warmth.  No erythema.  Once again I do not appreciate left axillary adenopathy.  Skin:    General: Skin is warm and dry.       Neurological:     Mental Status: She is alert and oriented to person, place, and time.     Data Reviewed  MRI of the brain obtained [facility] at [facility]: There is a hypoenhancing mass within the right pituitary gland measuring 5 x 8 x 7 mm. The infundibulum is slightly deviated to the left. There is no midline shift or mass lesion. There is no evidence of intracranial hemorrhage or acute infarct. There are scattered and confluent T2/FLAIR hyperintense foci within the periventricular and deep white matter which are nonspecific, however consistent with chronic small vessel ischemic disease. There are a few small old right pontine infarcts.  There are no extra-axial fluid collections present. No diffusion weighted signal abnormality is identified.  There is otherwise no abnormal enhancement. There is S-shaped deviation of the  nasal septum.  IMPRESSION: --8 mm right pituitary mass. While this could represent an adenoma, follow-up is recommended in 6-8 weeks with pituitary protocol MRI, given history of melanoma to rule out the possibility of metastasis. Also recommend correlation with pituitary function tests  CT scan of the chest obtained today at [facility]: Ill-defined right axillary fluid collection with simple fluid attenuation, possibly postoperative seroma (given history of  recent excisional biopsy at outside facility based on report available in care everywhere). Recommend clinical correlation.  0.5cm perifissural nodule in middle lobe; indeterminate. Comparison with prior studies would be helpful if available, otherwise recommend follow up CT in 6months for further assessment.  Mildly enlarged bilateral axillary lymph nodes measuring up to 1.0 cm. Recommend attention on follow up  Severe age indeterminate compression deformity of the T12 vertebral body with focal kyphosis and osseous retropulsion resulting in at least mild spinal canal narrowing at this level. (Previous MRI in 2016 at the time of acute compression fracture noted).  CT scan of the abdomen and pelvis obtained today at [facility]: Mild sigmoid diverticulitis without evidence of perforation or organized fluid collection.  Consider followup colonoscopy (if one has not recently been performed) to ensure there is not an underlying colonic malignancy.   1.6 cm left adnexal cystic structure with mild left hydroureter proximal to the ovary. If the clinical team is able to obtain any prior outside imaging of the pelvis (or recent gynecologic ultrasound report), comparison could be made to see if this is a stable finding. If unavailable,  followup imaging  with endovaginal ultrasound (to evaluate the left adnexa in more detail) or contrast-enhanced CT (that would evaluate both the left adnexa for interval change in size as well as the sigmoid colon)  could be considered.  T12 vertebral body collapse, likely benign.  Ultrasound  examination of the left axilla completed today: Ultrasound examination was undertaken to correlate with the CT findings of the chest noted above.  In the axilla a 1.13 x 1.16 x 1.26 cm enlarged lymph node is noted with some cortical thickening.  This is near the apex of the axilla.  In the midportion of the axilla a smaller lymph node with a maximum diameter of 0.63 cm is noted.  At the lower level of the axilla to lymph nodes are noted with moderate cortical thickening measuring up to 0.34 cm.  Maximum diameter 1.03 cm.  The largest lymph node near the apex of the axilla has lost its the typical elliptical shape and is suspicious for possible metastatic disease.  Previously obtained CBC, comprehensive metabolic panel and LDH are all normal.  Surgical biopsy of the right axilla dated August 24, 2018:  A. LYMPH NODES, RIGHT AXILLA; EXCISIONAL BIOPSY:  - METASTATIC MELANOMA INVOLVING AT LEAST FOUR LYMPH NODES.   Comment:  There is a central mass of matted lymph nodes, largely replaced by  metastatic high-grade malignant neoplasm. Some of the adjacent small  lymph nodes contain metastatic tumor in subcapsular sinuses. Focally  there are neoplastic cells containing cytoplasmic pigment, and all areas  of the tumor are associated with prominent pigment-laden macrophages. In  most areas the tumor cells have an epithelioid morphology, with marked  nuclear pleomorphism and extensive necrosis. There is one nodular area  with smaller cells and myxoid background. The mitotic rate is very high,  20 mitotic figures per square mm.   Immunohistochemistry (IHC) was performed for confirmation of the  diagnosis. The neoplastic cells are positive for HMB-45 and S100 with  diffuse strong staining. They are negative for cytokeratins  (AE1/AE3/PCK26). The IHC profile is confirmatory.   Assessment Metastatic malignant melanoma with  unknown primary.  Likely bilateral axillary disease.  Small pituitary lesion.  5 mm intrapleural lymph node, observation.  Cystic lesion in the left ovary.  Mild left hydroureter.  Changes consistent with acute diverticulitis.  Plan We will arrange for a PET/CT to assess for other sites of metastatic disease.  The case was discussed with Dr. Tasia Catchings from medical oncology.  Arrangements are in progress for assessment in the next 24-48 hours.  If the PET scan is positive in the left axilla the patient will likely not benefit from axillary dissection on the right.  If needed, biopsy confirmation of the left axillary pathology can be obtained.  Will await medical oncology assessment regarding his evaluation of the small pituitary lesion.  At present the patient shows no evidence of chemical/hormonal imbalance.  The patient has been encouraged to make use of local heat to the axilla to assist with resolution of the seroma.  HPI, Physical Exam, Assessment and Plan have been scribed under the direction and in the presence of Robert Bellow, MD  Concepcion Living, LPN  I have completed the exam and reviewed the above documentation for accuracy and completeness.  I agree with the above.  Haematologist has been used and any errors in dictation or transcription are unintentional.  Hervey Ard, M.D., F.A.C.S.  Forest Gleason Edgar Corrigan 08/31/2018, 7:43 PM     A referral has been made for the patient to be seen with Dr. Tasia Catchings. Dr. Bary Castilla did speak with Dr. Tasia Catchings this afternoon and will have staff call the patient to schedule an appointment for tomorrow.   Patient has been scheduled for a PET scan for on Friday, 09-03-18 at 8:30 am (arrive  8 am). Prep: no carbs 24 hours prior and NPO after midnight. Daughter, Katie Keith notified of date, time, and instructions.   Dominga Ferry, CMA

## 2018-09-01 ENCOUNTER — Other Ambulatory Visit: Payer: Self-pay

## 2018-09-01 ENCOUNTER — Inpatient Hospital Stay: Payer: PPO | Attending: Oncology | Admitting: Oncology

## 2018-09-01 ENCOUNTER — Encounter: Payer: Self-pay | Admitting: Oncology

## 2018-09-01 VITALS — BP 131/86 | HR 79 | Temp 97.3°F | Resp 18 | Ht 62.0 in | Wt 138.9 lb

## 2018-09-01 DIAGNOSIS — Z801 Family history of malignant neoplasm of trachea, bronchus and lung: Secondary | ICD-10-CM

## 2018-09-01 DIAGNOSIS — Z86000 Personal history of in-situ neoplasm of breast: Secondary | ICD-10-CM | POA: Diagnosis not present

## 2018-09-01 DIAGNOSIS — I700 Atherosclerosis of aorta: Secondary | ICD-10-CM

## 2018-09-01 DIAGNOSIS — E237 Disorder of pituitary gland, unspecified: Secondary | ICD-10-CM | POA: Diagnosis not present

## 2018-09-01 DIAGNOSIS — Z803 Family history of malignant neoplasm of breast: Secondary | ICD-10-CM

## 2018-09-01 DIAGNOSIS — C439 Malignant melanoma of skin, unspecified: Secondary | ICD-10-CM | POA: Diagnosis not present

## 2018-09-01 DIAGNOSIS — M4854XA Collapsed vertebra, not elsewhere classified, thoracic region, initial encounter for fracture: Secondary | ICD-10-CM

## 2018-09-01 DIAGNOSIS — C773 Secondary and unspecified malignant neoplasm of axilla and upper limb lymph nodes: Secondary | ICD-10-CM

## 2018-09-01 DIAGNOSIS — R911 Solitary pulmonary nodule: Secondary | ICD-10-CM | POA: Diagnosis not present

## 2018-09-01 DIAGNOSIS — K5732 Diverticulitis of large intestine without perforation or abscess without bleeding: Secondary | ICD-10-CM

## 2018-09-01 DIAGNOSIS — R590 Localized enlarged lymph nodes: Secondary | ICD-10-CM

## 2018-09-01 DIAGNOSIS — Z7189 Other specified counseling: Secondary | ICD-10-CM

## 2018-09-01 NOTE — Progress Notes (Signed)
Patient here to establish care. She is currently on antibiotics for diverticulitis.

## 2018-09-01 NOTE — Progress Notes (Signed)
Hematology/Oncology Consult note [facility] Telephone:([phone] [phone]   Patient Care Team: Jerrol Banana., MD as PCP - General (Family Medicine) Bary Castilla, Forest Gleason, MD (General Surgery) Vladimir Crofts, MD as Consulting Physician (Neurology) Carloyn Manner, MD as Referring Physician (Otolaryngology)  REFERRING PROVIDER: Dr.Byrnett CHIEF COMPLAINTS/REASON FOR VISIT:  Evaluation of melanoma  HISTORY OF PRESENTING ILLNESS:  Katie Keith is a  73 y.o.  female with PMH listed below who was referred to me for evaluation of melanoma.  Patient was seen by primary care physician on 08/19/2018 for evaluation of a tender mass under her right arm.  She first noticed the right axillary mass on 08/13/2018.  Denies any drainage, fever, chills, lumps in the breast. Remote history of Breast DCIS in 1992 s/p lumpectomy.  Last screening mammogram on 10/27/2018 showed no mammographic evidence of malignancy.  Patient was referred to Dr.Byrnett for further evaluation. Physical exanimation showed 3cm fullness in right axilla.  08/19/2018 [place] of right axilla showed large round hypoechoic area with  Faint posterior acousitic enhancement measuring 2.8 x 3.3 x 3.6 cm.  08/24/2018 patient underwent excision of right axillary lymph node. Pathology showed metastatic melanoma involving at least 4 lymph nodes.  #Patient has had staging CT chest abdomen pelvis with contrast at [facility] Ill-defined right axillary fluid collection with simple fluid attenuation, possibly postoperative seroma. 0.5 perifissural nodule in the middle lobe, indeterminate. Mildly enlarged bilateral axillary lymph node measuring up to 1 cm. Severe age indeterminate compression deformity of the T12 vertebral body with focal kyphosis and osseous retropulsion resulting in at least mild spinal canal narrowing at this level.  Mild sigmoid diverticulitis without evidence of perforation or  organized fluid collection. 1.6 cm left adnexal cystic structure with mild left hydroureter proximal to the ovary.  T12 vertebral body collapse likely benign.  #MRI brain showed 8 mm right pituitary mass.  While this could represent adenoma, follow-up is recommended in 6 to 8 weeks with pituitary protocol MRI.   Patient husband passed away last year and she is coping with it.  She has had weight loss about 4 pounds since 6 months ago.  Appetite is fair.  Denies any pain today. Denies any personal history of melanoma.  Review of Systems  Constitutional: Negative for appetite change, chills, fatigue and fever.  HENT:   Negative for hearing loss and voice change.   Eyes: Negative for eye problems.  Respiratory: Negative for chest tightness and cough.   Cardiovascular: Negative for chest pain.  Gastrointestinal: Negative for abdominal distention, abdominal pain and blood in stool.  Endocrine: Negative for hot flashes.  Genitourinary: Negative for difficulty urinating and frequency.   Musculoskeletal: Negative for arthralgias.  Skin: Negative for itching and rash.  Neurological: Negative for extremity weakness.  Hematological: Negative for adenopathy.  Psychiatric/Behavioral: Negative for confusion.    MEDICAL HISTORY:  Past Medical History:  Diagnosis Date   Anemia    b12 injections twice a month   Breast cancer ([place]) 1992   LT LUMPECTOMY DCIS   Cancer ([place]) 1992   DCIS left breast, treated with wide excision.   Complication of anesthesia    Diverticulosis 2006   GERD (gastroesophageal reflux disease)    Hypertension    Hypothyroidism    Migraines    Peripheral neuropathy    feet   PONV (postoperative nausea and vomiting)    Thyroid disease     SURGICAL HISTORY: Past Surgical History:  Procedure Laterality Date  AXILLARY LYMPH NODE BIOPSY Right 08/24/2018   Procedure: AXILLARY LYMPH NODE BIOPSY;  Surgeon: Robert Bellow, MD;  Location: [facility] ORS;   Service: General;  Laterality: Right;   BREAST BIOPSY Left 03/21/2016   2 areas done by byrnett fibrocystic changes   BREAST LUMPECTOMY Left 1993   DCIS, no chemo no radation   BREAST SURGERY Left 1992   wide excision for DCIS   CATARACT EXTRACTION W/PHACO Left 08/04/2017   Procedure: CATARACT EXTRACTION PHACO AND INTRAOCULAR LENS PLACEMENT ([place]);  Surgeon: Birder Robson, MD;  Location: [facility] ORS;  Service: Ophthalmology;  Laterality: Left;  [place] 00:40.7 AP% 14.6 CDE 5.97 Fluid Pack Lot # [id number]   CATARACT EXTRACTION W/PHACO Right 08/18/2017   Procedure: CATARACT EXTRACTION PHACO AND INTRAOCULAR LENS PLACEMENT ([facility]);  Surgeon: Birder Robson, MD;  Location: [facility] ORS;  Service: Ophthalmology;  Laterality: Right;  [place] 00:51.9 AP% 17.1 CDE 8.84 Fluid Pack Lot # [id number] H   COLONOSCOPY  2006   Dr Bary Castilla   COLONOSCOPY WITH PROPOFOL N/A 04/18/2015   Procedure: COLONOSCOPY WITH PROPOFOL;  Surgeon: Robert Bellow, MD;  Location: [facility] END[facility]COPY;  Service: Endoscopy;  Laterality: N/A;   HERNIA REPAIR     thumb surgery     injury with infection    SOCIAL HISTORY: Social History   Socioeconomic History   Marital status: Widowed    Spouse name: Not on file   Number of children: 3   Years of education: Not on file   Highest education level: Associate degree: occupational, Hotel manager, or vocational program  Occupational History   Occupation: retired  Scientist, product/process development strain: Not hard at [employer] insecurity:    Worry: Never true    Inability: Never true   Transportation needs:    Medical: No    Non-medical: No  Tobacco Use   Smoking status: Never Smoker   Smokeless tobacco: Never Used  Substance and Sexual Activity   Alcohol use: No   Drug use: No   Sexual activity: Not on file  Lifestyle   Physical activity:    Days per week: 0 days    Minutes per session: 0 min   Stress: To some extent  Relationships   Social connections:     Talks on phone: Patient refused    Gets together: Patient refused    Attends religious service: Patient refused    Active member of club or organization: Patient refused    Attends meetings of clubs or organizations: Patient refused    Relationship status: Patient refused   Intimate partner violence:    Fear of current or ex partner: Patient refused    Emotionally abused: Patient refused    Physically abused: Patient refused    Forced sexual activity: Patient refused  Other Topics Concern   Not on file  Social History Narrative   Husband passed in 11/2017.    FAMILY HISTORY: Family History  Problem Relation Age of Onset   Breast cancer Mother        78'S   Diabetes Mother    Lung cancer Father    Cancer Sister 33       breast   Breast cancer Sister 43   Breast cancer Paternal Grandmother        10'S   Diverticulitis Son        part of colon removed   Neuropathy Son    Diabetes Sister    Hypertension Sister    Neuropathy Son  Diverticulitis Son     ALLERGIES:  is allergic to penicillins and sulfa antibiotics.  MEDICATIONS:  Current Outpatient Medications  Medication Sig Dispense Refill   acetaminophen (TYLENOL) 325 MG tablet Take 325-975 mg by mouth every 6 (six) hours as needed for moderate pain or headache.     alendronate (FOSAMAX) 70 MG tablet Take 1 tablet (70 mg total) by mouth every 7 (seven) days. Take with a full glass of water on an empty stomach. 4 tablet 11   Artificial Tear Ointment (DRY EYES OP) Apply 1 drop to eye as needed (dry eyes).      aspirin 81 MG tablet Take 81 mg by mouth daily. In the evening     Biotin 5000 MCG CAPS Take 5,000 mcg by mouth daily.      Calcium Carbonate-Vitamin D (CALCIUM + D PO) Take 1 tablet by mouth daily. In the morning     carvedilol (COREG) 6.25 MG tablet TAKE 1 TABLET BY MOUTH TWICE DAILY 60 tablet 11   CINNAMON PO Take 1 capsule by mouth daily. In the morning     ciprofloxacin (CIPRO) 500 MG  tablet Take 1 tablet (500 mg total) by mouth 2 (two) times daily for 7 days. 14 tablet 0   cyanocobalamin (,VITAMIN B-12,) 1000 MCG/ML injection Inject 1000 mcg IM every 2 weeks (Patient taking differently: Inject 1,000 mcg into the muscle every 14 (fourteen) days. Inject 1000 mcg IM every 2 weeks) 10 mL 3   ibuprofen (ADVIL,MOTRIN) 200 MG tablet Take 200-400 mg by mouth every 6 (six) hours as needed for headache or moderate pain.     levothyroxine (SYNTHROID, LEVOTHROID) 112 MCG tablet TAKE 1 TABLET BY MOUTH ONCE DAILY BEFORE BREAKFAST (Patient taking differently: Take 112 mcg by mouth daily before breakfast. ) 60 tablet 6   metroNIDAZOLE (FLAGYL) 500 MG tablet Take 1 tablet (500 mg total) by mouth 3 (three) times daily for 7 days. 21 tablet 0   NEEDLE, DISP, 25 G 25G X 1" MISC Inject B12 twice a month 10 each 12   Omega-3 Fatty Acids (FISH OIL) 1000 MG CAPS Take 1,000 mg by mouth daily. In the morning     OVER THE COUNTER MEDICATION Take 1 capsule by mouth 2 (two) times daily. Alphoric Acid     sodium chloride (OCEAN) 0.65 % SOLN nasal spray Place 1 spray into both nostrils as needed for congestion.     Syringe, Disposable, 3 ML MISC Use to inject B12 twice a month 25 each 12   No current facility-administered medications for this visit.      PHYSICAL EXAMINATION: ECOG PERFORMANCE STATUS: 0 - Asymptomatic Vitals:   09/01/18 1117  BP: 131/86  Pulse: 79  Resp: 18  Temp: (!) 97.3 F (36.3 C)   Filed Weights   09/01/18 1117  Weight: 138 lb 14.4 oz (63 kg)    Physical Exam Constitutional:      General: She is not in acute distress. HENT:     Head: Normocephalic and atraumatic.  Eyes:     General: No scleral icterus.    Pupils: Pupils are equal, round, and reactive to light.  Neck:     Musculoskeletal: Normal range of motion and neck supple.  Cardiovascular:     Rate and Rhythm: Normal rate and regular rhythm.     Heart sounds: Normal heart sounds.  Pulmonary:      Effort: Pulmonary effort is normal. No respiratory distress.     Breath sounds: No wheezing.  Abdominal:     General: Bowel sounds are normal. There is no distension.     Palpations: Abdomen is soft. There is no mass.     Tenderness: There is no abdominal tenderness.  Musculoskeletal: Normal range of motion.        General: No deformity.  Skin:    General: Skin is warm and dry.     Findings: No erythema or rash.     Comments: I performed whole body skin examination did not identify any suspicious lesions.  Neurological:     Mental Status: She is alert and oriented to person, place, and time.     Cranial Nerves: No cranial nerve deficit.     Coordination: Coordination normal.  Psychiatric:        Behavior: Behavior normal.        Thought Content: Thought content normal.   Breast exam was performed in seated and lying down position. No palpable breast masses bilaterally. Right axillary s/p exisional Lymph node biopsy, palpable seroma, some tenderness. I can not appreciate left axillary lymph node.    RADIOGRAPHIC STUDIES: I have personally reviewed the radiological images as listed and agreed with the findings in the report.  CMP Latest Ref Rng & Units 08/20/2018  Glucose 65 - 99 mg/dL 90  BUN 8 - 27 mg/dL 16  Creatinine 0.57 - 1.00 mg/dL 0.73  Sodium 134 - 144 mmol/L 139  Potassium 3.5 - 5.2 mmol/L 4.5  Chloride 96 - 106 mmol/L 103  CO2 20 - 29 mmol/L 20  Calcium 8.7 - 10.3 mg/dL 9.8  Total Protein 6.0 - 8.5 g/dL 6.9  Total Bilirubin 0.0 - 1.2 mg/dL 0.5  Alkaline Phos 39 - 117 IU/L 79  AST 0 - 40 IU/L 16  ALT 0 - 32 IU/L 9   CBC Latest Ref Rng & Units 08/20/2018  WBC 3.4 - 10.8 x10E3/uL 7.1  Hemoglobin 11.1 - 15.9 g/dL 12.6  Hematocrit 34.0 - 46.6 % 38.6  Platelets 150 - 450 x10E3/uL 283    LABORATORY DATA:  I have reviewed the data as listed Lab Results  Component Value Date   WBC 7.1 08/20/2018   HGB 12.6 08/20/2018   HCT 38.6 08/20/2018   MCV 84 08/20/2018    PLT 283 08/20/2018   Recent Labs    09/08/17 1429 08/20/18 1119  NA 140 139  K 4.0 4.5  CL 101 103  CO2 21 20  GLUCOSE 109* 90  BUN 17 16  CREATININE 0.74 0.73  CALCIUM 9.6 9.8  GFRNONAA 81 81  GFRAA 93 94  PROT 7.1 6.9  ALBUMIN 4.1 3.9  AST 25 16  ALT 13 9  ALKPHOS 94 79  BILITOT 0.4 0.5   Iron/TIBC/Ferritin/ %Sat    Component Value Date/Time   IRON 40 02/04/2016 0900     RADIOGRAPHIC STUDIES: I have personally reviewed the radiological images as listed and agreed with the findings in the report. PET 09/02/2018 1. Right axillary fluid collection with peripheral hypermetabolism.Likely post biopsy or postsurgical in etiology. 2. No evidence of hypermetabolic primary melanoma or metastatic disease otherwise. 3. T12 compression deformity with mildly increased activity involving the T11 vertebral body. Favored to be physiologic/reactive. 4. Left-sided diverticulitis, as in clinical history. 5.  Aortic Atherosclerosis (ICD10-I70.0).  ASSESSMENT & PLAN:  1. Malignant melanoma, unspecified site ([place])   2. Axillary lymphadenopathy   3. Goals of care, counseling/discussion   4. Pituitary lesion [facility])    Reviewed image results and pathology and discussed with  patient and her daughter Daisy Lazar.  Diagnosis of axillary lymph node metastasis of melanoma discussed. Physical examination did not identify any suspicious cutaneous lesions. We will obtain NGS with Omniseq testing, to determined BRAF status and if any other targetable mutations. Goal of care discussed.Curative intent.   MRI brain reviewed.  Most likely pituitary adenoma, metastasis cannot be ruled out.  She will need a follow-up pituitary protocol MRI in 6 to 8 weeks.   CT chest abdomen pelvis showed cystic structure 1.6 cm left adnexal area.  Bilateral axillary lymphadenopathy. Small indeterminate lung nodule.  Agree with proceeding with a PET scan to determine extent of disease.  PET image was independently reviewed  and discussed with patient's daughter. .  No evidence of hypermetabolic metastatic disease.  Case was discussed on Tumor board on 09/02/2018 and consensus as below. obtain bilateral diagnostic mammogram.  Proceed with right axillary lymph node dissection followed by adjuvant Radiation and immunotherapy     Orders Placed This Encounter  Procedures   MM DIAG BREAST TOMO BILATERAL    Standing Status:   Future    Standing Expiration Date:   11/02/2019    Order Specific Question:   Reason for Exam (SYMPTOM  OR DIAGNOSIS REQUIRED)    Answer:   axillary lymadenopathy, melanoma    Order Specific Question:   Preferred imaging location?    Answer:   [facility]   US BREAST LTD UNI LEFT INC AXILLA    Standing Status:   Future    Standing Expiration Date:   11/02/2019    Order Specific Question:   Reason for Exam (SYMPTOM  OR DIAGNOSIS REQUIRED)    Answer:   axillary lymphadenopathy, melanoma    Order Specific Question:   Preferred imaging location?    Answer:   [facility]   US BREAST LTD UNI RIGHT INC AXILLA    Standing Status:   Future    Standing Expiration Date:   11/02/2019    Order Specific Question:   Reason for Exam (SYMPTOM  OR DIAGNOSIS REQUIRED)    Answer:   axillary lymphadenopathy melanoma    Order Specific Question:   Preferred imaging location?    Answer:   [facility]    All questions were answered. The patient knows to call the clinic with any problems questions or concerns.  Return of visit: to be determined.  Thank you for this kind referral and the opportunity to participate in the care of this patient. A copy of today's note is routed to referring provider  .    Earlie Server, MD, PhD Hematology Oncology [facility] Pager- [phone] 09/01/2018

## 2018-09-02 ENCOUNTER — Ambulatory Visit: Payer: PPO

## 2018-09-02 ENCOUNTER — Telehealth: Payer: Self-pay | Admitting: *Deleted

## 2018-09-02 ENCOUNTER — Ambulatory Visit
Admission: RE | Admit: 2018-09-02 | Discharge: 2018-09-02 | Disposition: A | Discharge status: Home / Self Care | Payer: PPO | Source: Ambulatory Visit | Attending: General Surgery | Admitting: General Surgery

## 2018-09-02 ENCOUNTER — Other Ambulatory Visit: Payer: Self-pay

## 2018-09-02 ENCOUNTER — Other Ambulatory Visit: Payer: Self-pay | Admitting: General Surgery

## 2018-09-02 ENCOUNTER — Other Ambulatory Visit: Payer: PPO

## 2018-09-02 DIAGNOSIS — I700 Atherosclerosis of aorta: Secondary | ICD-10-CM | POA: Insufficient documentation

## 2018-09-02 DIAGNOSIS — C439 Malignant melanoma of skin, unspecified: Secondary | ICD-10-CM

## 2018-09-02 DIAGNOSIS — C799 Secondary malignant neoplasm of unspecified site: Secondary | ICD-10-CM

## 2018-09-02 DIAGNOSIS — C779 Secondary and unspecified malignant neoplasm of lymph node, unspecified: Secondary | ICD-10-CM | POA: Diagnosis not present

## 2018-09-02 DIAGNOSIS — Z853 Personal history of malignant neoplasm of breast: Secondary | ICD-10-CM | POA: Diagnosis not present

## 2018-09-02 DIAGNOSIS — K5792 Diverticulitis of intestine, part unspecified, without perforation or abscess without bleeding: Secondary | ICD-10-CM | POA: Diagnosis not present

## 2018-09-02 DIAGNOSIS — Z7189 Other specified counseling: Secondary | ICD-10-CM | POA: Insufficient documentation

## 2018-09-02 DIAGNOSIS — R590 Localized enlarged lymph nodes: Secondary | ICD-10-CM | POA: Insufficient documentation

## 2018-09-02 LAB — GLUCOSE, CAPILLARY: Glucose-Capillary: 71 mg/dL (ref 70–99)

## 2018-09-02 MED ORDER — FLUDEOXYGLUCOSE F - 18 (FDG) INJECTION
7.2000 | Freq: Once | INTRAVENOUS | Status: AC | PRN
  Administered 2018-09-02: 7.4 via INTRAVENOUS

## 2018-09-02 NOTE — Progress Notes (Addendum)
Tumor Board Documentation  MIKEALA GIRDLER was presented by Drs Elpidio Eric at our Tumor Board on 09/02/2018, which included representatives from medical oncology, radiation oncology, surgical oncology, surgical, radiology, pathology, navigation, internal medicine, pharmacy, genetics, research, palliative care, pulmonology.  Dalene currently presents as a new patient, for new positive pathology, for discussion, for [place] with history of the following treatments: surgical intervention(s).  Additionally, we reviewed previous medical and familial history, history of present illness, and recent lab results along with all available histopathologic and imaging studies. The tumor board considered available treatment options and made the following recommendations: Surgery, Active surveillance, Immunotherapy Opthamalic Exam, Diagnostic Mammogram, Lymph NOde dissection  The following procedures/referrals were also placed: No orders of the defined types were placed in this encounter.   Clinical Trial Status: not discussed   Staging used: AJCC Stage Group  AJCC Staging:       Group: Metastatic Melanoma   National site-specific guidelines NCCN were discussed with respect to the case.  Tumor board is a meeting of clinicians from various specialty areas who evaluate and discuss patients for whom a multidisciplinary approach is being considered. Final determinations in the plan of care are those of the provider(s). The responsibility for follow up of recommendations given during tumor board is that of the provider.   Today's extended care, comprehensive team conference, Satya was not present for the discussion and was not examined.   Multidisciplinary Tumor Board is a multidisciplinary case peer review process.  Decisions discussed in the Multidisciplinary Tumor Board reflect the opinions of the specialists present at the conference without having examined the patient.  Ultimately, treatment and  diagnostic decisions rest with the primary provider(s) and the patient.

## 2018-09-02 NOTE — Telephone Encounter (Signed)
Patient's surgery to be scheduled for 09-06-18 at [facility] with Dr. Bary Castilla. It is okay for patient to continue an 81 mg aspirin once daily.   The patient is aware she may be contacted by the [place] Department to complete a phone interview sometime in the near future. If not, she will follow previous instructions given for surgery that was done on 08-24-18.  Patient aware to call tomorrow between 1 and 3 pm to get arrival time for Monday- [phone]. She is aware to check in at the [facility], 2nd floor Same Day Surgery.   The patient is aware to call the office should she have further questions.

## 2018-09-02 NOTE — Patient Instructions (Signed)
Nivolumab injection  What is this medicine?  NIVOLUMAB (nye VOL ue mab) is a monoclonal antibody. It is used to treat melanoma, lung cancer, kidney cancer, head and neck cancer, Hodgkin lymphoma, urothelial cancer, colon cancer, and liver cancer.  This medicine may be used for other purposes; ask your health care provider or pharmacist if you have questions.  COMMON BRAND NAME(S): Opdivo  What should I tell my health care provider before I take this medicine?  They need to know if you have any of these conditions:  -diabetes  -immune system problems  -kidney disease  -liver disease  -lung disease  -organ transplant  -stomach or intestine problems  -thyroid disease  -an unusual or allergic reaction to nivolumab, other medicines, foods, dyes, or preservatives  -pregnant or trying to get pregnant  -breast-feeding  How should I use this medicine?  This medicine is for infusion into a vein. It is given by a health care professional in a hospital or clinic setting.  A special MedGuide will be given to you before each treatment. Be sure to read this information carefully each time.  Talk to your pediatrician regarding the use of this medicine in children. While this drug may be prescribed for children as young as 12 years for selected conditions, precautions do apply.  Overdosage: If you think you have taken too much of this medicine contact a poison control center or emergency room at once.  NOTE: This medicine is only for you. Do not share this medicine with others.  What if I miss a dose?  It is important not to miss your dose. Call your doctor or health care professional if you are unable to keep an appointment.  What may interact with this medicine?  Interactions have not been studied.  Give your health care provider a list of all the medicines, herbs, non-prescription drugs, or dietary supplements you use. Also tell them if you smoke, drink alcohol, or use illegal drugs. Some items may interact with your  medicine.  This list may not describe all possible interactions. Give your health care provider a list of all the medicines, herbs, non-prescription drugs, or dietary supplements you use. Also tell them if you smoke, drink alcohol, or use illegal drugs. Some items may interact with your medicine.  What should I watch for while using this medicine?  This drug may make you feel generally unwell. Continue your course of treatment even though you feel ill unless your doctor tells you to stop.  You may need blood work done while you are taking this medicine.  Do not become pregnant while taking this medicine or for 5 months after stopping it. Women should inform their doctor if they wish to become pregnant or think they might be pregnant. There is a potential for serious side effects to an unborn child. Talk to your health care professional or pharmacist for more information. Do not breast-feed an infant while taking this medicine or for 5 months after stopping it.  What side effects may I notice from receiving this medicine?  Side effects that you should report to your doctor or health care professional as soon as possible:  -allergic reactions like skin rash, itching or hives, swelling of the face, lips, or tongue  -breathing problems  -blood in the urine  -bloody or watery diarrhea or black, tarry stools  -changes in emotions or moods  -changes in vision  -chest pain  -cough  -dizziness  -feeling faint or lightheaded, falls  -fever,   chills  -headache with fever, neck stiffness, confusion, loss of memory, sensitivity to light, hallucination, loss of contact with reality, or seizures  -joint pain  -mouth sores  -redness, blistering, peeling or loosening of the skin, including inside the mouth  -severe muscle pain or weakness  -signs and symptoms of high blood sugar such as dizziness; dry mouth; dry skin; fruity breath; nausea; stomach pain; increased hunger or thirst; increased urination  -signs and symptoms of kidney  injury like trouble passing urine or change in the amount of urine  -signs and symptoms of liver injury like dark yellow or brown urine; general ill feeling or flu-like symptoms; light-colored stools; loss of appetite; nausea; right upper belly pain; unusually weak or tired; yellowing of the eyes or skin  -swelling of the ankles, feet, hands  -trouble passing urine or change in the amount of urine  -unusually weak or tired  -weight gain or loss  Side effects that usually do not require medical attention (report to your doctor or health care professional if they continue or are bothersome):  -bone pain  -constipation  -decreased appetite  -diarrhea  -muscle pain  -nausea, vomiting  -tiredness  This list may not describe all possible side effects. Call your doctor for medical advice about side effects. You may report side effects to FDA at 1-800-FDA-1088.  Where should I keep my medicine?  This drug is given in a hospital or clinic and will not be stored at home.  NOTE: This sheet is a summary. It may not cover all possible information. If you have questions about this medicine, talk to your doctor, pharmacist, or health care provider.   2019 Elsevier/Gold Standard (2017-10-14 12:55:04)  Ipilimumab injection  What is this medicine?  IPILIMUMAB (IP i LIM ue mab) is a monoclonal antibody. It is used to treat melanoma, colorectal cancer, and kidney cancer.  This medicine may be used for other purposes; ask your health care provider or pharmacist if you have questions.  COMMON BRAND NAME(S): YERVOY  What should I tell my health care provider before I take this medicine?  They need to know if you have any of these conditions:  -Addison's disease  -blood in your stools (black or tarry stools) or if you have blood in your vomit  -eye disease, vision problems  -history of pancreatitis  -history of stomach bleeding  -immune system problems  -inflammatory bowel disease  -kidney disease  -liver disease  -lupus  -myasthenia  gravis  -organ transplant  -rheumatoid arthritis  -sarcoidosis  -stomach or intestine problems  -thyroid disease  -tingling of the fingers or toes, or other nerve disorder  -an unusual or allergic reaction to ipilimumab, other medicines, foods, dyes, or preservatives  -pregnant or trying to get pregnant  -breast-feeding  How should I use this medicine?  This medicine is for infusion into a vein. It is given by a health care professional in a hospital or clinic setting.  A special MedGuide will be given to you before each treatment. Be sure to read this information carefully each time.  Talk to your pediatrician regarding the use of this medicine in children. While this drug may be prescribed for children as young as 12 years for selected conditions, precautions do apply.  Overdosage: If you think you have taken too much of this medicine contact a poison control center or emergency room at once.  NOTE: This medicine is only for you. Do not share this medicine with others.  What if I   miss a dose?  It is important not to miss your dose. Call your doctor or health care professional if you are unable to keep an appointment.  What may interact with this medicine?  Interactions are not expected.  This list may not describe all possible interactions. Give your health care provider a list of all the medicines, herbs, non-prescription drugs, or dietary supplements you use. Also tell them if you smoke, drink alcohol, or use illegal drugs. Some items may interact with your medicine.  What should I watch for while using this medicine?  Tell your doctor or healthcare professional if your symptoms do not start to get better or if they get worse.  Do not become pregnant while taking this medicine or for 3 months after stopping it. Women should inform their doctor if they wish to become pregnant or think they might be pregnant. There is a potential for serious side effects to an unborn child. Talk to your health care professional or  pharmacist for more information. Do not breast-feed an infant while taking this medicine or for 3 months after the last dose.  Your condition will be monitored carefully while you are receiving this medicine.  You may need blood work done while you are taking this medicine.  What side effects may I notice from receiving this medicine?  Side effects that you should report to your doctor or health care professional as soon as possible:  -allergic reactions like skin rash, itching or hives, swelling of the face, lips, or tongue  -black, tarry stools  -bloody or watery diarrhea  -changes in vision  -dizziness  -eye pain  -fast, irregular heartbeat  -feeling anxious  -feeling faint or lightheaded, falls  -nausea, vomiting  -pain, tingling, numbness in the hands or feet  -redness, blistering, peeling or loosening of the skin, including inside the mouth  -signs and symptoms of liver injury like dark yellow or brown urine; general ill feeling or flu-like symptoms; light-colored stools; loss of appetite; nausea; right upper belly pain; unusually weak or tired; yellowing of the eyes or skin  -unusual bleeding or bruising  Side effects that usually do not require medical attention (report to your doctor or health care professional if they continue or are bothersome):  -headache  -loss of appetite  -trouble sleeping  This list may not describe all possible side effects. Call your doctor for medical advice about side effects. You may report side effects to FDA at 1-800-FDA-1088.  Where should I keep my medicine?  This drug is given in a hospital or clinic and will not be stored at home.  NOTE: This sheet is a summary. It may not cover all possible information. If you have questions about this medicine, talk to your doctor, pharmacist, or health care provider.   2019 Elsevier/Gold Standard (2017-09-18 13:21:42)

## 2018-09-03 ENCOUNTER — Inpatient Hospital Stay: Payer: PPO

## 2018-09-03 ENCOUNTER — Ambulatory Visit: Payer: PPO

## 2018-09-03 ENCOUNTER — Ambulatory Visit
Admission: RE | Admit: 2018-09-03 | Discharge: 2018-09-03 | Disposition: A | Discharge status: Home / Self Care | Payer: PPO | Source: Ambulatory Visit | Attending: Oncology | Admitting: Oncology

## 2018-09-03 ENCOUNTER — Ambulatory Visit
Admission: RE | Admit: 2018-09-03 | Discharge: 2018-09-03 | Disposition: A | Discharge status: Home / Self Care | Payer: Self-pay | Source: Ambulatory Visit | Attending: Oncology | Admitting: Oncology

## 2018-09-03 ENCOUNTER — Other Ambulatory Visit: Payer: Self-pay | Admitting: Oncology

## 2018-09-03 ENCOUNTER — Encounter: Payer: Self-pay | Admitting: *Deleted

## 2018-09-03 ENCOUNTER — Other Ambulatory Visit: Payer: Self-pay

## 2018-09-03 DIAGNOSIS — R590 Localized enlarged lymph nodes: Secondary | ICD-10-CM

## 2018-09-03 DIAGNOSIS — C439 Malignant melanoma of skin, unspecified: Secondary | ICD-10-CM

## 2018-09-03 DIAGNOSIS — C779 Secondary and unspecified malignant neoplasm of lymph node, unspecified: Secondary | ICD-10-CM | POA: Diagnosis not present

## 2018-09-06 ENCOUNTER — Encounter
Admission: RE | Disposition: A | Discharge status: Home / Self Care | Payer: Self-pay | Source: Home / Self Care | Attending: General Surgery

## 2018-09-06 ENCOUNTER — Ambulatory Visit: Payer: PPO | Admitting: Registered Nurse

## 2018-09-06 ENCOUNTER — Other Ambulatory Visit: Payer: Self-pay

## 2018-09-06 ENCOUNTER — Encounter: Payer: Self-pay | Admitting: *Deleted

## 2018-09-06 ENCOUNTER — Ambulatory Visit
Admission: RE | Admit: 2018-09-06 | Discharge: 2018-09-06 | Disposition: A | Discharge status: Home / Self Care | Payer: PPO | Attending: General Surgery | Admitting: General Surgery

## 2018-09-06 DIAGNOSIS — Z79890 Hormone replacement therapy (postmenopausal): Secondary | ICD-10-CM | POA: Insufficient documentation

## 2018-09-06 DIAGNOSIS — Z880 Allergy status to penicillin: Secondary | ICD-10-CM | POA: Diagnosis not present

## 2018-09-06 DIAGNOSIS — Z79899 Other long term (current) drug therapy: Secondary | ICD-10-CM | POA: Insufficient documentation

## 2018-09-06 DIAGNOSIS — Z853 Personal history of malignant neoplasm of breast: Secondary | ICD-10-CM | POA: Insufficient documentation

## 2018-09-06 DIAGNOSIS — I10 Essential (primary) hypertension: Secondary | ICD-10-CM | POA: Diagnosis not present

## 2018-09-06 DIAGNOSIS — C799 Secondary malignant neoplasm of unspecified site: Secondary | ICD-10-CM | POA: Diagnosis not present

## 2018-09-06 DIAGNOSIS — E039 Hypothyroidism, unspecified: Secondary | ICD-10-CM | POA: Insufficient documentation

## 2018-09-06 DIAGNOSIS — Z7982 Long term (current) use of aspirin: Secondary | ICD-10-CM | POA: Diagnosis not present

## 2018-09-06 DIAGNOSIS — Z961 Presence of intraocular lens: Secondary | ICD-10-CM | POA: Insufficient documentation

## 2018-09-06 DIAGNOSIS — C4359 Malignant melanoma of other part of trunk: Secondary | ICD-10-CM | POA: Diagnosis not present

## 2018-09-06 DIAGNOSIS — Z9842 Cataract extraction status, left eye: Secondary | ICD-10-CM | POA: Insufficient documentation

## 2018-09-06 DIAGNOSIS — G629 Polyneuropathy, unspecified: Secondary | ICD-10-CM | POA: Insufficient documentation

## 2018-09-06 DIAGNOSIS — Z9841 Cataract extraction status, right eye: Secondary | ICD-10-CM | POA: Insufficient documentation

## 2018-09-06 DIAGNOSIS — C773 Secondary and unspecified malignant neoplasm of axilla and upper limb lymph nodes: Secondary | ICD-10-CM | POA: Insufficient documentation

## 2018-09-06 DIAGNOSIS — Z882 Allergy status to sulfonamides status: Secondary | ICD-10-CM | POA: Diagnosis not present

## 2018-09-06 DIAGNOSIS — C439 Malignant melanoma of skin, unspecified: Secondary | ICD-10-CM | POA: Insufficient documentation

## 2018-09-06 DIAGNOSIS — D649 Anemia, unspecified: Secondary | ICD-10-CM | POA: Diagnosis not present

## 2018-09-06 HISTORY — DX: Malignant melanoma of skin, unspecified: C43.9

## 2018-09-06 HISTORY — PX: AXILLARY LYMPH NODE DISSECTION: SHX5229

## 2018-09-06 HISTORY — DX: Other disorders of pituitary gland: E23.6

## 2018-09-06 HISTORY — PX: AXILLARY LYMPH NODE BIOPSY: SHX5737

## 2018-09-06 HISTORY — DX: Localized enlarged lymph nodes: R59.0

## 2018-09-06 SURGERY — LYMPHADENECTOMY, AXILLARY
Anesthesia: General | Laterality: Bilateral

## 2018-09-06 MED ORDER — BUPIVACAINE-EPINEPHRINE 0.5% -1:200000 IJ SOLN
INTRAMUSCULAR | Status: DC | PRN
  Administered 2018-09-06: 8 mL
  Administered 2018-09-06: 22 mL

## 2018-09-06 MED ORDER — METHYLENE BLUE 0.5 % INJ SOLN
INTRAVENOUS | Status: AC
  Filled 2018-09-06: qty 10

## 2018-09-06 MED ORDER — LACTATED RINGERS IV SOLN
INTRAVENOUS | Status: DC
  Administered 2018-09-06: 07:00:00 via INTRAVENOUS

## 2018-09-06 MED ORDER — GABAPENTIN 300 MG PO CAPS
ORAL_CAPSULE | ORAL | Status: AC
  Administered 2018-09-06: 300 mg via ORAL
  Filled 2018-09-06: qty 1

## 2018-09-06 MED ORDER — PROPOFOL 500 MG/50ML IV EMUL
INTRAVENOUS | Status: DC | PRN
  Administered 2018-09-06: 20 ug/kg/min via INTRAVENOUS

## 2018-09-06 MED ORDER — EPINEPHRINE PF 1 MG/ML IJ SOLN
INTRAMUSCULAR | Status: AC
  Filled 2018-09-06: qty 1

## 2018-09-06 MED ORDER — FENTANYL CITRATE (PF) 100 MCG/2ML IJ SOLN: 25.0000 ug | INTRAMUSCULAR | Status: DC | PRN

## 2018-09-06 MED ORDER — ONDANSETRON HCL 4 MG/2ML IJ SOLN
INTRAMUSCULAR | Status: AC
  Filled 2018-09-06: qty 2

## 2018-09-06 MED ORDER — EPHEDRINE SULFATE 50 MG/ML IJ SOLN
INTRAMUSCULAR | Status: DC | PRN
  Administered 2018-09-06 (×3): 5 mg via INTRAVENOUS

## 2018-09-06 MED ORDER — GABAPENTIN 300 MG PO CAPS
300.0000 mg | ORAL_CAPSULE | ORAL | Status: AC
  Administered 2018-09-06: 300 mg via ORAL

## 2018-09-06 MED ORDER — ACETAMINOPHEN 10 MG/ML IV SOLN
INTRAVENOUS | Status: AC
  Filled 2018-09-06: qty 100

## 2018-09-06 MED ORDER — ONDANSETRON HCL 4 MG/2ML IJ SOLN: 4.0000 mg | Freq: Once | INTRAMUSCULAR | Status: DC | PRN

## 2018-09-06 MED ORDER — HYDROCODONE-ACETAMINOPHEN 5-325 MG PO TABS: 1.0000 | ORAL_TABLET | ORAL | 0 refills | Status: DC | PRN

## 2018-09-06 MED ORDER — FAMOTIDINE 20 MG PO TABS
ORAL_TABLET | ORAL | Status: AC
  Administered 2018-09-06: 20 mg via ORAL
  Filled 2018-09-06: qty 1

## 2018-09-06 MED ORDER — PHENYLEPHRINE HCL 10 MG/ML IJ SOLN
INTRAMUSCULAR | Status: AC
  Filled 2018-09-06: qty 1

## 2018-09-06 MED ORDER — BUPIVACAINE HCL (PF) 0.5 % IJ SOLN
INTRAMUSCULAR | Status: AC
  Filled 2018-09-06: qty 30

## 2018-09-06 MED ORDER — FENTANYL CITRATE (PF) 100 MCG/2ML IJ SOLN
INTRAMUSCULAR | Status: DC | PRN
  Administered 2018-09-06: 25 ug via INTRAVENOUS

## 2018-09-06 MED ORDER — DEXAMETHASONE SODIUM PHOSPHATE 10 MG/ML IJ SOLN
INTRAMUSCULAR | Status: DC | PRN
  Administered 2018-09-06: 5 mg via INTRAVENOUS

## 2018-09-06 MED ORDER — ONDANSETRON HCL 4 MG/2ML IJ SOLN
INTRAMUSCULAR | Status: DC | PRN
  Administered 2018-09-06: 4 mg via INTRAVENOUS

## 2018-09-06 MED ORDER — KETOROLAC TROMETHAMINE 30 MG/ML IJ SOLN
INTRAMUSCULAR | Status: AC
  Filled 2018-09-06: qty 1

## 2018-09-06 MED ORDER — EPHEDRINE SULFATE 50 MG/ML IJ SOLN
INTRAMUSCULAR | Status: AC
  Filled 2018-09-06: qty 1

## 2018-09-06 MED ORDER — LIDOCAINE HCL (PF) 2 % IJ SOLN
INTRAMUSCULAR | Status: AC
  Filled 2018-09-06: qty 10

## 2018-09-06 MED ORDER — ACETAMINOPHEN 10 MG/ML IV SOLN
INTRAVENOUS | Status: DC | PRN
  Administered 2018-09-06: 1000 mg via INTRAVENOUS

## 2018-09-06 MED ORDER — DEXAMETHASONE SODIUM PHOSPHATE 10 MG/ML IJ SOLN
INTRAMUSCULAR | Status: AC
  Filled 2018-09-06: qty 1

## 2018-09-06 MED ORDER — FENTANYL CITRATE (PF) 100 MCG/2ML IJ SOLN
INTRAMUSCULAR | Status: AC
  Filled 2018-09-06: qty 2

## 2018-09-06 MED ORDER — PROPOFOL 10 MG/ML IV BOLUS
INTRAVENOUS | Status: AC
  Filled 2018-09-06: qty 20

## 2018-09-06 MED ORDER — LIDOCAINE HCL (CARDIAC) PF 100 MG/5ML IV SOSY
PREFILLED_SYRINGE | INTRAVENOUS | Status: DC | PRN
  Administered 2018-09-06: 60 mg via INTRAVENOUS

## 2018-09-06 MED ORDER — KETOROLAC TROMETHAMINE 30 MG/ML IJ SOLN
INTRAMUSCULAR | Status: DC | PRN
  Administered 2018-09-06: 15 mg via INTRAVENOUS

## 2018-09-06 MED ORDER — FAMOTIDINE 20 MG PO TABS
20.0000 mg | ORAL_TABLET | Freq: Once | ORAL | Status: AC
  Administered 2018-09-06: 20 mg via ORAL

## 2018-09-06 MED ORDER — PROPOFOL 10 MG/ML IV BOLUS
INTRAVENOUS | Status: DC | PRN
  Administered 2018-09-06: 150 mg via INTRAVENOUS

## 2018-09-06 SURGICAL SUPPLY — 51 items
APL PRP STRL LF DISP 70% ISPRP (MISCELLANEOUS) ×1
APPLIER CLIP 11 MED OPEN (CLIP) ×3
APR CLP MED 11 20 MLT OPN (CLIP) ×1
BANDAGE ELASTIC 6 LF NS (GAUZE/BANDAGES/DRESSINGS) ×3 IMPLANT
BLADE SURG 15 STRL SS SAFETY (BLADE) ×3 IMPLANT
BNDG CMPR MED 5X6 ELC HKLP NS (GAUZE/BANDAGES/DRESSINGS) ×1
BNDG GAUZE 4.5X4.1 6PLY STRL (MISCELLANEOUS) ×3 IMPLANT
BULB RESERV EVAC DRAIN JP 100C (MISCELLANEOUS) ×2 IMPLANT
CANISTER SUCT 1200ML W/VALVE (MISCELLANEOUS) ×3 IMPLANT
CHLORAPREP W/TINT 26 (MISCELLANEOUS) ×3 IMPLANT
CLIP APPLIE 11 MED OPEN (CLIP) ×1 IMPLANT
CLOSURE WOUND 1/2 X4 (GAUZE/BANDAGES/DRESSINGS) ×1
COVER WAND RF STERILE (DRAPES) ×3 IMPLANT
DRAIN CHANNEL JP 15F RND 16 (MISCELLANEOUS) ×2 IMPLANT
DRAPE LAPAROTOMY TRNSV 106X77 (MISCELLANEOUS) ×3 IMPLANT
DRSG GAUZE FLUFF 36X18 (GAUZE/BANDAGES/DRESSINGS) ×3 IMPLANT
DRSG TEGADERM 4X4.75 (GAUZE/BANDAGES/DRESSINGS) ×5 IMPLANT
DRSG TELFA 4X3 1S NADH ST (GAUZE/BANDAGES/DRESSINGS) ×5 IMPLANT
ELECT REM PT RETURN 9FT ADLT (ELECTROSURGICAL) ×3
ELECTRODE REM PT RTRN 9FT ADLT (ELECTROSURGICAL) ×1 IMPLANT
GLOVE BIO SURGEON STRL SZ7.5 (GLOVE) ×3 IMPLANT
GLOVE INDICATOR 8.0 STRL GRN (GLOVE) ×3 IMPLANT
GOWN STRL REUS W/ TWL LRG LVL3 (GOWN DISPOSABLE) ×2 IMPLANT
GOWN STRL REUS W/TWL LRG LVL3 (GOWN DISPOSABLE) ×6
KIT TURNOVER KIT A (KITS) ×3 IMPLANT
LABEL OR SOLS (LABEL) ×3 IMPLANT
MARGIN MAP 10MM (MISCELLANEOUS) ×3 IMPLANT
NDL HYPO 25X1 1.5 SAFETY (NEEDLE) ×1 IMPLANT
NEEDLE HYPO 25X1 1.5 SAFETY (NEEDLE) ×3 IMPLANT
NS IRRIG 500ML POUR BTL (IV SOLUTION) ×3 IMPLANT
PACK BASIN MINOR ARMC (MISCELLANEOUS) ×3 IMPLANT
RETRACTOR RING XSMALL (MISCELLANEOUS) IMPLANT
RTRCTR WOUND ALEXIS 13CM XS SH (MISCELLANEOUS) ×3
SHEARS FOC LG CVD HARMONIC 17C (MISCELLANEOUS) ×3 IMPLANT
SPONGE KITTNER 5P (MISCELLANEOUS) ×2 IMPLANT
STRIP CLOSURE SKIN 1/2X4 (GAUZE/BANDAGES/DRESSINGS) ×2 IMPLANT
SUT ETHILON 3-0 FS-10 30 BLK (SUTURE) ×3
SUT SILK 2 0 (SUTURE) ×3
SUT SILK 2-0 30XBRD TIE 12 (SUTURE) ×1 IMPLANT
SUT VIC AB 0 CT1 36 (SUTURE) ×3 IMPLANT
SUT VIC AB 2-0 CT1 27 (SUTURE) ×9
SUT VIC AB 2-0 CT1 TAPERPNT 27 (SUTURE) ×1 IMPLANT
SUT VIC AB 3-0 54X BRD REEL (SUTURE) ×1 IMPLANT
SUT VIC AB 3-0 BRD 54 (SUTURE) ×3
SUT VIC AB 4-0 PS2 18 (SUTURE) ×3 IMPLANT
SUT VIC AB 4-0 SH 27 (SUTURE) ×3
SUT VIC AB 4-0 SH 27XANBCTRL (SUTURE) IMPLANT
SUTURE EHLN 3-0 FS-10 30 BLK (SUTURE) IMPLANT
SWABSTK COMLB BENZOIN TINCTURE (MISCELLANEOUS) ×1 IMPLANT
SYR 10ML LL (SYRINGE) ×3 IMPLANT
TAPE TRANSPORE STRL 2 31045 (GAUZE/BANDAGES/DRESSINGS) ×2 IMPLANT

## 2018-09-06 NOTE — H&P (Signed)
Metastatic melanoma of unknown primary. For right axillary dissection for tumor debulking and left axillary biopsy to assess abnormal node on CT/ ultrasound. No change in clinical condition.

## 2018-09-06 NOTE — Anesthesia Preprocedure Evaluation (Signed)
Anesthesia Evaluation  Patient identified by MRN, date of birth, ID band Patient awake    Reviewed: Allergy & Precautions, NPO status , Patient's Chart, lab work & pertinent test results  History of Anesthesia Complications (+) PONV and history of anesthetic complications  Airway Mallampati: II  TM Distance: >3 FB Neck ROM: Full    Dental  (+) Partial Upper   Pulmonary neg pulmonary ROS, neg sleep apnea, neg COPD,    breath sounds clear to auscultation- rhonchi (-) wheezing      Cardiovascular hypertension, Pt. on medications (-) CAD, (-) Past MI, (-) Cardiac Stents and (-) CABG  Rhythm:Regular Rate:Normal - Systolic murmurs and - Diastolic murmurs    Neuro/Psych  Headaches, neg Seizures negative psych ROS   GI/Hepatic Neg liver ROS, GERD  ,  Endo/Other  neg diabetesHypothyroidism   Renal/GU negative Renal ROS     Musculoskeletal negative musculoskeletal ROS (+)   Abdominal (+) - obese,   Peds  Hematology  (+) anemia ,   Anesthesia Other Findings Past Medical History: No date: Anemia     Comment:  b12 injections twice a month 1992: Breast cancer ([place])     Comment:  LT LUMPECTOMY DCIS 1992: Cancer ([place])     Comment:  DCIS left breast, treated with wide excision. No date: Complication of anesthesia 2006: Diverticulosis No date: GERD (gastroesophageal reflux disease) No date: Hypertension No date: Hypothyroidism No date: Migraines No date: Peripheral neuropathy     Comment:  feet No date: PONV (postoperative nausea and vomiting) No date: Thyroid disease   Reproductive/Obstetrics                             Anesthesia Physical  Anesthesia Plan  ASA: II  Anesthesia Plan: General   Post-op Pain Management:    Induction: Intravenous  PONV Risk Score and Plan: 3 and Ondansetron, Dexamethasone, Propofol infusion and Midazolam  Airway Management Planned: LMA  Additional  Equipment:   Intra-op Plan:   Post-operative Plan: Extubation in OR  Informed Consent: I have reviewed the patients History and Physical, chart, labs and discussed the procedure including the risks, benefits and alternatives for the proposed anesthesia with the patient or authorized representative who has indicated his/her understanding and acceptance.     Dental advisory given  Plan Discussed with: CRNA and Anesthesiologist  Anesthesia Plan Comments:         Anesthesia Quick Evaluation

## 2018-09-06 NOTE — Anesthesia Post-op Follow-up Note (Signed)
Anesthesia QCDR form completed.        

## 2018-09-06 NOTE — Discharge Instructions (Addendum)

## 2018-09-06 NOTE — Anesthesia Procedure Notes (Signed)
Procedure Name: LMA Insertion Date/Time: 09/06/2018 7:43 AM Performed by: Hedda Slade, CRNA Pre-anesthesia Checklist: Patient identified, Patient being monitored, Timeout performed, Emergency Drugs available and Suction available Patient Re-evaluated:Patient Re-evaluated prior to induction Oxygen Delivery Method: Circle system utilized Preoxygenation: Pre-oxygenation with 100% oxygen Induction Type: IV induction Ventilation: Mask ventilation without difficulty LMA: LMA inserted LMA Size: 3.5 Tube type: Oral Number of attempts: 1 Placement Confirmation: positive ETCO2 and breath sounds checked- equal and bilateral Tube secured with: Tape Dental Injury: Teeth and Oropharynx as per pre-operative assessment  Comments: Sore on patients mid-lower lip prior to LMA insertion

## 2018-09-06 NOTE — Transfer of Care (Signed)
Immediate Anesthesia Transfer of Care Note  Patient: Katie Keith  Procedure(s) Performed: AXILLARY LYMPH NODE DISSECTION RIGHT (Bilateral ) AXILLARY LYMPH NODE BIOPSY LEFT (Bilateral )  Patient Location: PACU  Anesthesia Type:General  Level of Consciousness: sedated  Airway & Oxygen Therapy: Patient Spontanous Breathing and Patient connected to face mask oxygen  Post-op Assessment: Report given to RN and Post -op Vital signs reviewed and stable  Post vital signs: Reviewed and stable  Last Vitals:  Vitals Value Taken Time  BP 139/61 09/06/2018  9:47 AM  Temp 36.2 C 09/06/2018  9:47 AM  Pulse 81 09/06/2018  9:47 AM  Resp 9 09/06/2018  9:47 AM  SpO2 98 % 09/06/2018  9:47 AM  Vitals shown include unvalidated device data.  Last Pain:  Vitals:   09/06/18 0947  TempSrc:   PainSc: 0-No pain         Complications: No apparent anesthesia complications

## 2018-09-06 NOTE — Op Note (Signed)
Preoperative diagnosis: Metastatic melanoma to the right axilla.  Unknown primary.  Enlarged contralateral lymph node.  Postoperative diagnosis: Same.  Operative procedure: Left axillary node biopsy with ultrasound guidance.  Right axillary dissection.  Operating Surgeon: Hervey Ard, MD.  Anesthesia: General by LMA, Marcaine 0.5% with 1 to 200,000 units of epinephrine, 30 cc.  Estimated blood loss: 15 cc.  Clinical note: This 73 year old woman presented with a right axillary mass.  Node biopsy showed evidence of metastatic melanoma.  PET scan was negative.  CT scan showed mildly enlarged contralateral nodes and ultrasound showed an abnormal echo pattern.  She was felt to be a candidate for left axillary node biopsy and formal axillary dissection on the right.  The patient is completing a course of oral Cipro and Flagyl for diverticulitis and additional antibiotics were not prescribed.  Operative note: With the patient under adequate general anesthesia the breast neck chest and axilla was prepped bilaterally with ChloraPrep and draped.  The left side was approached first.  Ultrasound was used to identify the enlarged node with the abnormal echo pattern.  Local anesthetic was infiltrated.  A transverse incision was made.  The skin was incised sharply and remaining dissection completed electrocautery and the harmonic scalpel.  The 1/2 cm node was resected with good hemostasis.  The axillary envelope was closed with interrupted 2-0 Vicryl figure-of-eight sutures.  The adipose tissue was approximated in a similar fashion and the skin closed with a running 4-0 Monocryl suture.  Benzoin and Steri-Strips followed by Telfa and Tegaderm dressing applied.  Attention was turned to the right side.  The original incision was opened and a small seroma drained.  The pectoralis muscle was identified and the lateral border freed with a harmonic scalpel.  The axillary artery and vein were identified and  protected.  The apex of the axilla was swept and a 1 cm node was sent as the highest node.  This appeared grossly positive.  The tissue below the level of the axillary vein was swept inferiorly and laterally.  The long thoracic nerve of Bell and thoracodorsal nerve artery and vein bundles were identified.  The axillary contents were removed and sent in formalin for routine histology.  Excellent hemostasis was noted.  Both nerves were functioning at the end of the procedure.  The axillary envelope was closed after placement of a 15 [place] Blake drain.  This was brought out through a separate stab wound incision and anchored into place with a 3-0 nylon suture.  The axillary wound was closed in layers with 2-0 Vicryl figure-of-eight sutures.  The skin was closed with a running 4-0 Vicryl subcuticular suture.  Benzoin Steri-Strips followed by Telfa and Tegaderm dressing applied.  The patient tolerated the procedure well and was taken to recovery room in stable condition.

## 2018-09-07 ENCOUNTER — Encounter: Payer: Self-pay | Admitting: General Surgery

## 2018-09-08 ENCOUNTER — Ambulatory Visit: Payer: PPO

## 2018-09-08 LAB — SURGICAL PATHOLOGY

## 2018-09-08 NOTE — Anesthesia Postprocedure Evaluation (Signed)
Anesthesia Post Note  Patient: Katie Keith  Procedure(s) Performed: AXILLARY LYMPH NODE DISSECTION RIGHT (Bilateral ) AXILLARY LYMPH NODE BIOPSY LEFT (Bilateral )  Patient location during evaluation: PACU Anesthesia Type: General Level of consciousness: awake and alert and oriented Pain management: pain level controlled Vital Signs Assessment: post-procedure vital signs reviewed and stable Respiratory status: spontaneous breathing Cardiovascular status: blood pressure returned to baseline Anesthetic complications: no     Last Vitals:  Vitals:   09/06/18 1058 09/06/18 1142  BP: (!) 172/93 132/84  Pulse: 75 77  Resp: 16 16  Temp: (!) 36.3 C   SpO2: 100% 96%    Last Pain:  Vitals:   09/07/18 0840  TempSrc:   PainSc: 2                  Ladarren Steiner

## 2018-09-10 ENCOUNTER — Other Ambulatory Visit: Payer: Self-pay

## 2018-09-10 ENCOUNTER — Ambulatory Visit (INDEPENDENT_AMBULATORY_CARE_PROVIDER_SITE_OTHER): Payer: PPO | Admitting: General Surgery

## 2018-09-10 DIAGNOSIS — C439 Malignant melanoma of skin, unspecified: Secondary | ICD-10-CM

## 2018-09-10 DIAGNOSIS — C799 Secondary malignant neoplasm of unspecified site: Secondary | ICD-10-CM

## 2018-09-10 NOTE — Progress Notes (Signed)
Patient seen at her home. Blake drainage volume down to15 cc/ day. Drain site clean. Mild thickening in the axilla from wound closure. No evidence of loculated fluid. Left axilla healing well. Drain removed without incident. Gauze and Tegaderm dressing applied.  Will cancel next weeks appointment if she is not having any swelling. Asked to refrain from repetitive activities with her arms for another week.  She is aware of the pathology findings.

## 2018-09-13 ENCOUNTER — Other Ambulatory Visit: Payer: Self-pay

## 2018-09-13 ENCOUNTER — Encounter: Payer: Self-pay | Admitting: General Surgery

## 2018-09-13 DIAGNOSIS — R590 Localized enlarged lymph nodes: Secondary | ICD-10-CM

## 2018-09-13 DIAGNOSIS — C439 Malignant melanoma of skin, unspecified: Secondary | ICD-10-CM

## 2018-09-14 ENCOUNTER — Encounter: Payer: PPO | Admitting: General Surgery

## 2018-09-15 ENCOUNTER — Telehealth: Payer: Self-pay | Admitting: *Deleted

## 2018-09-15 NOTE — Telephone Encounter (Signed)
Dr. Tasia Catchings will be working remotely on Monday,pt is scheduled for [employer]. Just called pt to let her know.

## 2018-09-15 NOTE — Telephone Encounter (Signed)
Patient called asking why her [employer] visit was changed to an office visit Monday. She is scheduled a Web Ex visit with Dr Tasia Catchings then a consult ov with Dr Baruch Gouty. Please return her call [phone].

## 2018-09-20 ENCOUNTER — Ambulatory Visit
Admission: RE | Admit: 2018-09-20 | Discharge: 2018-09-20 | Disposition: A | Discharge status: Home / Self Care | Payer: PPO | Source: Ambulatory Visit | Attending: Radiation Oncology | Admitting: Radiation Oncology

## 2018-09-20 ENCOUNTER — Encounter: Payer: Self-pay | Admitting: Oncology

## 2018-09-20 ENCOUNTER — Ambulatory Visit: Payer: PPO | Admitting: Oncology

## 2018-09-20 ENCOUNTER — Encounter: Payer: Self-pay | Admitting: Radiation Oncology

## 2018-09-20 ENCOUNTER — Inpatient Hospital Stay: Payer: PPO | Attending: Oncology | Admitting: Oncology

## 2018-09-20 ENCOUNTER — Other Ambulatory Visit: Payer: Self-pay

## 2018-09-20 VITALS — BP 143/89 | HR 78 | Temp 99.7°F | Wt 138.0 lb

## 2018-09-20 DIAGNOSIS — K219 Gastro-esophageal reflux disease without esophagitis: Secondary | ICD-10-CM | POA: Diagnosis not present

## 2018-09-20 DIAGNOSIS — Z7982 Long term (current) use of aspirin: Secondary | ICD-10-CM | POA: Insufficient documentation

## 2018-09-20 DIAGNOSIS — C4361 Malignant melanoma of right upper limb, including shoulder: Secondary | ICD-10-CM | POA: Insufficient documentation

## 2018-09-20 DIAGNOSIS — Z801 Family history of malignant neoplasm of trachea, bronchus and lung: Secondary | ICD-10-CM | POA: Insufficient documentation

## 2018-09-20 DIAGNOSIS — R911 Solitary pulmonary nodule: Secondary | ICD-10-CM | POA: Diagnosis not present

## 2018-09-20 DIAGNOSIS — D649 Anemia, unspecified: Secondary | ICD-10-CM | POA: Diagnosis not present

## 2018-09-20 DIAGNOSIS — I10 Essential (primary) hypertension: Secondary | ICD-10-CM | POA: Diagnosis not present

## 2018-09-20 DIAGNOSIS — G629 Polyneuropathy, unspecified: Secondary | ICD-10-CM | POA: Insufficient documentation

## 2018-09-20 DIAGNOSIS — C4360 Malignant melanoma of unspecified upper limb, including shoulder: Secondary | ICD-10-CM

## 2018-09-20 DIAGNOSIS — Z79899 Other long term (current) drug therapy: Secondary | ICD-10-CM

## 2018-09-20 DIAGNOSIS — E237 Disorder of pituitary gland, unspecified: Secondary | ICD-10-CM

## 2018-09-20 DIAGNOSIS — Z803 Family history of malignant neoplasm of breast: Secondary | ICD-10-CM | POA: Diagnosis not present

## 2018-09-20 DIAGNOSIS — C439 Malignant melanoma of skin, unspecified: Secondary | ICD-10-CM | POA: Diagnosis not present

## 2018-09-20 DIAGNOSIS — E039 Hypothyroidism, unspecified: Secondary | ICD-10-CM | POA: Diagnosis not present

## 2018-09-20 NOTE — Progress Notes (Signed)
Called pt for WebEx visit.  Patient c/o of some vaginal itching due to recent antibiotic, using cortisone and eating yogurt has helped.

## 2018-09-20 NOTE — Progress Notes (Signed)
HEMATOLOGY-ONCOLOGY TeleHEALTH VISIT PROGRESS NOTE  I connected with Katie Keith on 09/20/18 at  8:30 AM EDT by video enabled telemedicine visit and verified that I am speaking with the correct person using two identifiers. I discussed the limitations, risks, security and privacy concerns of performing an evaluation and management service by telemedicine and the availability of in-person appointments. I also discussed with the patient that there may be a patient responsible charge related to this service. The patient expressed understanding and agreed to proceed.   Other persons participating in the visit and their role in the encounter:  Geraldine Solar, [place], check in patient   Patient's daughter [name]   Patient's location: Home  Provider's location: home  Chief Complaint: Discussion of surgical pathology finalized melanoma treatment plan.    INTERVAL HISTORY Katie Keith is a 73 y.o. female who has above history reviewed by me today presents for follow up visit for management of discussion of surgical pathology results and finalizing oncology treatment plans. Problems and complaints are listed below:  During the interval,  09/03/2018 bilateral diagnostic mammogram showed no mammographic evidence of breast malignancy.  Lobular mass within the right axilla compatible with biopsy-proven malignant melanoma. 09/06/2018- patient has underwent left axillary node biopsy, right axillary dissection on  Patient tolerated procedure well. Pathology showed  1)one left axillary lymph node negative for malignancy 2) highest right axilla excision 1 lymph node involved by metastatic melanoma, measuring at least 2 mm in greatest extent, negative for extracapsular extension. 3) right axillary lymph node 5 out of 13 involved by metastatic melanoma.  Measuring at least 3 mm in greatest extent.  With extracapsular extension  Patient denies any pain today.  She has no new complaints.  Review of Systems   Constitutional: Negative for appetite change, chills, fatigue and fever.  HENT:   Negative for hearing loss and voice change.   Eyes: Negative for eye problems.  Respiratory: Negative for chest tightness and cough.   Cardiovascular: Negative for chest pain.  Gastrointestinal: Negative for abdominal distention, abdominal pain and blood in stool.  Endocrine: Negative for hot flashes.  Genitourinary: Negative for difficulty urinating and frequency.   Musculoskeletal: Negative for arthralgias.  Skin: Negative for itching and rash.  Neurological: Negative for extremity weakness.  Hematological: Negative for adenopathy.  Psychiatric/Behavioral: Negative for confusion.    Past Medical History:  Diagnosis Date  . Anemia    b12 injections twice a month  . Breast cancer ([place]) 1992   LT LUMPECTOMY DCIS  . Cancer ([place]) 1992   DCIS left breast, treated with wide excision.  . Complication of anesthesia   . Diverticulosis 2006  . Enlarged lymph nodes in armpit 08/2018  . GERD (gastroesophageal reflux disease)   . Hypertension   . Hypothyroidism   . Melanoma ([place]) 08/2018  . Migraines   . Peripheral neuropathy    feet  . Pituitary mass ([place]) 08/2018   right  . PONV (postoperative nausea and vomiting)   . Thyroid disease    Past Surgical History:  Procedure Laterality Date  . AXILLARY LYMPH NODE BIOPSY Right 08/24/2018   Procedure: AXILLARY LYMPH NODE BIOPSY;  Surgeon: Robert Bellow, MD;  Location: [facility] ORS;  Service: General;  Laterality: Right;  . AXILLARY LYMPH NODE BIOPSY Bilateral 09/06/2018   Procedure: AXILLARY LYMPH NODE BIOPSY LEFT;  Surgeon: Robert Bellow, MD;  Location: [facility] ORS;  Service: General;  Laterality: Bilateral;  . AXILLARY LYMPH NODE DISSECTION Bilateral 09/06/2018   Procedure: AXILLARY  LYMPH NODE DISSECTION RIGHT;  Surgeon: Robert Bellow, MD;  Location: [facility] ORS;  Service: General;  Laterality: Bilateral;  . BREAST BIOPSY Left 03/21/2016   2 areas  done by byrnett fibrocystic changes  . BREAST LUMPECTOMY Left 1993   DCIS, no chemo no radation  . BREAST SURGERY Left 1992   wide excision for DCIS  . CATARACT EXTRACTION W/PHACO Left 08/04/2017   Procedure: CATARACT EXTRACTION PHACO AND INTRAOCULAR LENS PLACEMENT (IOC);  Surgeon: Birder Robson, MD;  Location: [facility] ORS;  Service: Ophthalmology;  Laterality: Left;  [place] 00:40.7 AP% 14.6 CDE 5.97 Fluid Pack Lot # [id number]  . CATARACT EXTRACTION W/PHACO Right 08/18/2017   Procedure: CATARACT EXTRACTION PHACO AND INTRAOCULAR LENS PLACEMENT ([place]);  Surgeon: Birder Robson, MD;  Location: [facility] ORS;  Service: Ophthalmology;  Laterality: Right;  [place] 00:51.9 AP% 17.1 CDE 8.84 Fluid Pack Lot # [id number]  . COLONOSCOPY  2006   Dr Bary Castilla  . COLONOSCOPY WITH PROPOFOL N/A 04/18/2015   Procedure: COLONOSCOPY WITH PROPOFOL;  Surgeon: Robert Bellow, MD;  Location: [facility] ENDOSCOPY;  Service: Endoscopy;  Laterality: N/A;  . HERNIA REPAIR    . thumb surgery     injury with infection    Family History  Problem Relation Age of Onset  . Breast cancer Mother        68'S  . Diabetes Mother   . Lung cancer Father   . Cancer Sister 72       breast  . Breast cancer Sister 18  . Breast cancer Paternal Grandmother        70'S  . Diverticulitis Son        part of colon removed  . Neuropathy Son   . Diabetes Sister   . Hypertension Sister   . Neuropathy Son   . Diverticulitis Son     Social History   Socioeconomic History  . Marital status: Widowed    Spouse name: Not on file  . Number of children: 3  . Years of education: Not on file  . Highest education level: Associate degree: occupational, Hotel manager, or vocational program  Occupational History  . Occupation: retired  Scientific laboratory technician  . Financial resource strain: Not hard at all  . Food insecurity:    Worry: Never true    Inability: Never true  . Transportation needs:    Medical: No    Non-medical: No  Tobacco Use  . Smoking status:  Never Smoker  . Smokeless tobacco: Never Used  Substance and Sexual Activity  . Alcohol use: No  . Drug use: No  . Sexual activity: Not Currently  Lifestyle  . Physical activity:    Days per week: 0 days    Minutes per session: 0 min  . Stress: To some extent  Relationships  . Social connections:    Talks on phone: Patient refused    Gets together: Patient refused    Attends religious service: Patient refused    Active member of club or organization: Patient refused    Attends meetings of clubs or organizations: Patient refused    Relationship status: Patient refused  . Intimate partner violence:    Fear of current or ex partner: Patient refused    Emotionally abused: Patient refused    Physically abused: Patient refused    Forced sexual activity: Patient refused  Other Topics Concern  . Not on file  Social History Narrative   Husband passed in 11/2017.    Current Outpatient Medications on File Prior to  Visit  Medication Sig Dispense Refill  . acetaminophen (TYLENOL) 325 MG tablet Take 325-975 mg by mouth every 6 (six) hours as needed for moderate pain or headache.    . alendronate (FOSAMAX) 70 MG tablet Take 1 tablet (70 mg total) by mouth every 7 (seven) days. Take with a full glass of water on an empty stomach. 4 tablet 11  . Artificial Tear Ointment (DRY EYES OP) Apply 1 drop to eye as needed (dry eyes).     Marland Kitchen aspirin 81 MG tablet Take 81 mg by mouth daily. In the evening    . Biotin 5000 MCG CAPS Take 5,000 mcg by mouth daily.     . Calcium Carbonate-Vitamin D (CALCIUM + D PO) Take 1 tablet by mouth daily. In the morning    . carvedilol (COREG) 6.25 MG tablet TAKE 1 TABLET BY MOUTH TWICE DAILY 60 tablet 11  . CINNAMON PO Take 1 capsule by mouth daily. In the morning    . cyanocobalamin (,VITAMIN B-12,) 1000 MCG/ML injection Inject 1000 mcg IM every 2 weeks (Patient taking differently: Inject 1,000 mcg into the muscle every 14 (fourteen) days. Inject 1000 mcg IM every 2  weeks) 10 mL 3  . ibuprofen (ADVIL,MOTRIN) 200 MG tablet Take 200-400 mg by mouth every 6 (six) hours as needed for headache or moderate pain.    Marland Kitchen levothyroxine (SYNTHROID, LEVOTHROID) 112 MCG tablet TAKE 1 TABLET BY MOUTH ONCE DAILY BEFORE BREAKFAST (Patient taking differently: Take 112 mcg by mouth daily before breakfast. ) 60 tablet 6  . NEEDLE, DISP, 25 G 25G X 1" MISC Inject B12 twice a month 10 each 12  . Omega-3 Fatty Acids (FISH OIL) 1000 MG CAPS Take 1,000 mg by mouth daily. In the morning    . OVER THE COUNTER MEDICATION Take 1 capsule by mouth 2 (two) times daily. Alphoric Acid    . sodium chloride (OCEAN) 0.65 % SOLN nasal spray Place 1 spray into both nostrils as needed for congestion.    . Syringe, Disposable, 3 ML MISC Use to inject B12 twice a month 25 each 12   No current facility-administered medications on file prior to visit.     Allergies  Allergen Reactions  . Penicillins Swelling and Other (See Comments)    Has patient had a PCN reaction causing immediate rash, facial/tongue/throat swelling, SOB or lightheadedness with hypotension: Yes Has patient had a PCN reaction causing severe rash involving mucus membranes or skin necrosis: No Has patient had a PCN reaction that required hospitalization: No Has patient had a PCN reaction occurring within the last 10 years: No If all of the above answers are "NO", then may proceed with Cephalosporin use.   . Sulfa Antibiotics Swelling and Rash    swelling       Observations/Objective: Today's Vitals   09/20/18 0825  PainSc: 0-No pain   There is no height or weight on file to calculate BMI.  Physical Exam  Constitutional: She is oriented to person, place, and time. No distress.  HENT:  Head: Normocephalic and atraumatic.  Eyes: Pupils are equal, round, and reactive to light.  Neck: Normal range of motion.  Pulmonary/Chest: Effort normal. No respiratory distress.  Neurological: She is alert and oriented to person,  place, and time.  Psychiatric: Affect normal.   CBC    Component Value Date/Time   WBC 7.1 08/20/2018 1119   RBC 4.62 08/20/2018 1119   HGB 12.6 08/20/2018 1119   HCT 38.6 08/20/2018 1119  PLT 283 08/20/2018 1119   MCV 84 08/20/2018 1119   MCH 27.3 08/20/2018 1119   MCHC 32.6 08/20/2018 1119   RDW 14.9 08/20/2018 1119   LYMPHSABS 1.5 08/20/2018 1119   EOSABS 0.2 08/20/2018 1119   BASOSABS 0.0 08/20/2018 1119    CMP     Component Value Date/Time   NA 139 08/20/2018 1119   K 4.5 08/20/2018 1119   CL 103 08/20/2018 1119   CO2 20 08/20/2018 1119   GLUCOSE 90 08/20/2018 1119   BUN 16 08/20/2018 1119   CREATININE 0.73 08/20/2018 1119   CALCIUM 9.8 08/20/2018 1119   PROT 6.9 08/20/2018 1119   ALBUMIN 3.9 08/20/2018 1119   AST 16 08/20/2018 1119   ALT 9 08/20/2018 1119   ALKPHOS 79 08/20/2018 1119   BILITOT 0.5 08/20/2018 1119   GFRNONAA 81 08/20/2018 1119   GFRAA 94 08/20/2018 1119     Assessment and Plan: 1. Malignant melanoma, unspecified site ([place])   2. Pituitary lesion ([place])   3. Lung nodule seen on imaging study    Cancer Staging Malignant melanoma [facility]) Staging form: Melanoma of the Skin, AJCC 8th Edition - Clinical stage from 09/20/2018: Stage III (cTX, cN3b, cM0) - Signed by Earlie Server, MD on 09/20/2018  Clinical cTx pN3, M0 melanoma. We have sent Omniseq test, awaiting for results. BRAF mutation status pending. Patient is images were independent reviewed by me and discussed with patient.  Clinically no metastatic disease. Pathology was reviewed and discussed with patient.  She has at least stage III melanoma, with no primary site discovered. I recommend patient to receive adjuvant radiation to right axillary.  After she finishes radiation,  I recommend patient to be started on adjuvant immunotherapy with [facility]  # History of axillary dissection, she is at risk of lymphedema. Will need to refer her to lymphedema clinic.  # 57m pituitary mass, will repeat MRI  brain pituitary protocol in 6 weeks.- mid May  # Lung nodule will monitor via surveillance image.   Follow Up Instructions: Follow up after completion of Radiaiton.    I discussed the assessment and treatment plan with the patient. The patient was provided an opportunity to ask questions and all were answered. The patient agreed with the plan and demonstrated an understanding of the instructions.  The patient was advised to call back or seek an in-person evaluation if the symptoms worsen or if the condition fails to improve as anticipated.   I provided 25 minutes of face-to-face video visit time during this encounter, and > 50% was spent counseling as documented under my assessment & plan.  ZEarlie Server MD 09/20/2018 4:21 PM

## 2018-09-20 NOTE — Consult Note (Signed)
NEW PATIENT EVALUATION  Name: Katie Keith  MRN: [id number]  Date:   09/20/2018     DOB: 29-Jun-1945   This 73 y.o. female patient presents to the clinic for initial evaluation of metastatic malignant melanoma to her right axilla multiple nodes with extracapsular extension and patient with no known primary.  REFERRING PHYSICIAN: Jerrol Banana.,*  CHIEF COMPLAINT:  Chief Complaint  Patient presents with  . Melanoma    Initial eval    DIAGNOSIS: The encounter diagnosis was Malignant melanoma of upper arm, unspecified laterality ([place]).   PREVIOUS INVESTIGATIONS:  PET scan and CT scans reviewed Clinical notes reviewed Pathology report reviewed  HPI: Patient is a 73 year old female who presented with a tender right axillary mass to her primary care physician in early March 2020.  She has a history status post lumpectomy 1992 for ductal carcinoma in situ.  Her mammograms have been unremarkable.  She was seen by Dr. Tollie Pizza who noted a 3 cm fullness in the right axilla confirmed on ultrasound showing a 2.8 x 3.3 x 3.6 cm mass.  She underwent excisional biopsy of right axillary lymph nodes with pathology showing at least 4 nodes involved with malignant melanoma.  PET CT scan showed right axillary fluid collection with perihilar hypermetabolic activity.  No evidence of hypermetabolic [place] he or obvious primary tumor was noted on PET CT scan.  Patient underwent reexcision of the right axilla.  6 of 14 lymph nodes were involved by metastatic melanoma measuring at least 3 mm in greatest extent with extracapsular extension.  Her left axilla was also biopsied with 1 lymph node negative for malignancy.  She is done extremely well postoperatively is working at home on her garden with no limited mobility of her right upper extremity.  Her husband was a former patient of mine about 2 years prior.  She is seen today for radiation oncology opinion.  PLANNED TREATMENT REGIMEN: Right axillary and  supraclavicular radiation  PAST MEDICAL HISTORY:  has a past medical history of Anemia, Breast cancer ([place]) (1992), Cancer ([place]) (8127), Complication of anesthesia, Diverticulosis (2006), Enlarged lymph nodes in armpit (08/2018), GERD (gastroesophageal reflux disease), Hypertension, Hypothyroidism, Melanoma ([place]) (08/2018), Migraines, Peripheral neuropathy, Pituitary mass ([place]) (08/2018), PONV (postoperative nausea and vomiting), and Thyroid disease.    PAST SURGICAL HISTORY:  Past Surgical History:  Procedure Laterality Date  . AXILLARY LYMPH NODE BIOPSY Right 08/24/2018   Procedure: AXILLARY LYMPH NODE BIOPSY;  Surgeon: Robert Bellow, MD;  Location: [facility] ORS;  Service: General;  Laterality: Right;  . AXILLARY LYMPH NODE BIOPSY Bilateral 09/06/2018   Procedure: AXILLARY LYMPH NODE BIOPSY LEFT;  Surgeon: Robert Bellow, MD;  Location: [facility] ORS;  Service: General;  Laterality: Bilateral;  . AXILLARY LYMPH NODE DISSECTION Bilateral 09/06/2018   Procedure: AXILLARY LYMPH NODE DISSECTION RIGHT;  Surgeon: Robert Bellow, MD;  Location: [facility] ORS;  Service: General;  Laterality: Bilateral;  . BREAST BIOPSY Left 03/21/2016   2 areas done by byrnett fibrocystic changes  . BREAST LUMPECTOMY Left 1993   DCIS, no chemo no radation  . BREAST SURGERY Left 1992   wide excision for DCIS  . CATARACT EXTRACTION W/PHACO Left 08/04/2017   Procedure: CATARACT EXTRACTION PHACO AND INTRAOCULAR LENS PLACEMENT (IOC);  Surgeon: Birder Robson, MD;  Location: [facility] ORS;  Service: Ophthalmology;  Laterality: Left;  [place] 00:40.7 AP% 14.6 CDE 5.97 Fluid Pack Lot # [id number]  . CATARACT EXTRACTION W/PHACO Right 08/18/2017   Procedure: CATARACT EXTRACTION PHACO AND INTRAOCULAR LENS  PLACEMENT (IOC);  Surgeon: Birder Robson, MD;  Location: [facility] ORS;  Service: Ophthalmology;  Laterality: Right;  [place] 00:51.9 AP% 17.1 CDE 8.84 Fluid Pack Lot # [id number]  . COLONOSCOPY  2006   Dr Bary Castilla  . COLONOSCOPY WITH  PROPOFOL N/A 04/18/2015   Procedure: COLONOSCOPY WITH PROPOFOL;  Surgeon: Robert Bellow, MD;  Location[facility] END[facility]COPY;  Service: Endoscopy;  Laterality: N/A;  . HERNIA REPAIR    . thumb surgery     injury with infection    FAMILY HISTORY: family history includes Breast cancer in her mother and paternal grandmother; Breast cancer (age of onset: 69) in her sister; Cancer (age of onset: 49) in her sister; Diabetes in her mother and sister; Diverticulitis in her son and son; Hypertension in her sister; Lung cancer in her father; Neuropathy in her son and son.  SOCIAL HISTORY:  reports that she has never smoked. She has never used smokeless tobacco. She reports that she does not drink alcohol or use drugs.  ALLERGIES: Penicillins and Sulfa antibiotics  MEDICATIONS:  Current Outpatient Medications  Medication Sig Dispense Refill  . acetaminophen (TYLENOL) 325 MG tablet Take 325-975 mg by mouth every 6 (six) hours as needed for moderate pain or headache.    . alendronate (FOSAMAX) 70 MG tablet Take 1 tablet (70 mg total) by mouth every 7 (seven) days. Take with a full glass of water on an empty stomach. 4 tablet 11  . Artificial Tear Ointment (DRY EYES OP) Apply 1 drop to eye as needed (dry eyes).     Marland Kitchen aspirin 81 MG tablet Take 81 mg by mouth daily. In the evening    . Biotin 5000 MCG CAPS Take 5,000 mcg by mouth daily.     . Calcium Carbonate-Vitamin D (CALCIUM + D PO) Take 1 tablet by mouth daily. In the morning    . carvedilol (COREG) 6.25 MG tablet TAKE 1 TABLET BY MOUTH TWICE DAILY 60 tablet 11  . CINNAMON PO Take 1 capsule by mouth daily. In the morning    . cyanocobalamin (,VITAMIN B-12,) 1000 MCG/ML injection Inject 1000 mcg IM every 2 weeks (Patient taking differently: Inject 1,000 mcg into the muscle every 14 (fourteen) days. Inject 1000 mcg IM every 2 weeks) 10 mL 3  . ibuprofen (ADVIL,MOTRIN) 200 MG tablet Take 200-400 mg by mouth every 6 (six) hours as needed for headache or  moderate pain.    Marland Kitchen levothyroxine (SYNTHROID, LEVOTHROID) 112 MCG tablet TAKE 1 TABLET BY MOUTH ONCE DAILY BEFORE BREAKFAST (Patient taking differently: Take 112 mcg by mouth daily before breakfast. ) 60 tablet 6  . NEEDLE, DISP, 25 G 25G X 1" MISC Inject B12 twice a month 10 each 12  . Omega-3 Fatty Acids (FISH OIL) 1000 MG CAPS Take 1,000 mg by mouth daily. In the morning    . OVER THE COUNTER MEDICATION Take 1 capsule by mouth 2 (two) times daily. Alphoric Acid    . sodium chloride (OCEAN) 0.65 % SOLN nasal spray Place 1 spray into both nostrils as needed for congestion.    . Syringe, Disposable, 3 ML MISC Use to inject B12 twice a month 25 each 12   No current facility-administered medications for this encounter.     ECOG PERFORMANCE STATUS:  0 - Asymptomatic  REVIEW OF SYSTEMS: Patient denies any weight loss, fatigue, weakness, fever, chills or night sweats. Patient denies any loss of vision, blurred vision. Patient denies any ringing  of the ears or hearing loss. No  irregular heartbeat. Patient denies heart murmur or history of fainting. Patient denies any chest pain or pain radiating to her upper extremities. Patient denies any shortness of breath, difficulty breathing at night, cough or hemoptysis. Patient denies any swelling in the lower legs. Patient denies any nausea vomiting, vomiting of blood, or coffee ground material in the vomitus. Patient denies any stomach pain. Patient states has had normal bowel movements no significant constipation or diarrhea. Patient denies any dysuria, hematuria or significant nocturia. Patient denies any problems walking, swelling in the joints or loss of balance. Patient denies any skin changes, loss of hair or loss of weight. Patient denies any excessive worrying or anxiety or significant depression. Patient denies any problems with insomnia. Patient denies excessive thirst, polyuria, polydipsia. Patient denies any swollen glands, patient denies easy bruising  or easy bleeding. Patient denies any recent infections, allergies or URI. Patient "s visual fields have not changed significantly in recent time.   PHYSICAL EXAM: BP (!) 143/89   Pulse 78   Temp 99.7 F (37.6 C)   Wt 138 lb 0.1 oz (62.6 kg)   BMI 25.24 kg/m  No evidence of lymphedema in her right upper extremity.  Incision is well-healed in the right axilla.  Well-developed well-nourished patient in NAD. HEENT reveals PERLA, EOMI, discs not visualized.  Oral cavity is clear. No oral mucosal lesions are identified. Neck is clear without evidence of cervical or supraclavicular adenopathy. Lungs are clear to A&P. Cardiac examination is essentially unremarkable with regular rate and rhythm without murmur rub or thrill. Abdomen is benign with no organomegaly or masses noted. Motor sensory and DTR levels are equal and symmetric in the upper and lower extremities. Cranial nerves II through XII are grossly intact. Proprioception is intact. No peripheral adenopathy or edema is identified. No motor or sensory levels are noted. Crude visual fields are within normal range.  LABORATORY DATA: Pathology report reviewed    RADIOLOGY RESULTS: PET/CT and CT scans all reviewed compatible with above-stated findings   IMPRESSION: Locally advanced metastatic melanoma to her right axillary lymph nodes status post axillary resection in 73 year old female  PLAN: At this time at ahead with adjuvant radiation therapy to her right axilla and supraclavicular region.  I would plan on delivering 4800 cGy in 20 fractions at 2.4 Gy per fraction.  Risks and benefits of treatment occluding skin reaction fatigue possible development of lymphedema in her right upper extremity all were discussed in detail with the patient.  I have personally set up and ordered CT simulation for later this week.  Based on recent data with greater than 2 lymph nodes involved with metastatic melanoma in the right and the axilla as well as extracapsular  extension is poor prognostic features for which adjuvant radiation therapy is recommended.  Patient comprehends my treatment plan well.  Patient may be a candidate for immunotherapy after completion of radiation.  I would like to take this opportunity to thank you for allowing me to participate in the care of your patient.Noreene Filbert, MD

## 2018-09-21 ENCOUNTER — Ambulatory Visit
Admission: RE | Admit: 2018-09-21 | Discharge: 2018-09-21 | Disposition: A | Discharge status: Home / Self Care | Payer: PPO | Source: Ambulatory Visit | Attending: Radiation Oncology | Admitting: Radiation Oncology

## 2018-09-21 ENCOUNTER — Other Ambulatory Visit: Payer: Self-pay

## 2018-09-21 DIAGNOSIS — Z7983 Long term (current) use of bisphosphonates: Secondary | ICD-10-CM | POA: Insufficient documentation

## 2018-09-21 DIAGNOSIS — C801 Malignant (primary) neoplasm, unspecified: Secondary | ICD-10-CM | POA: Insufficient documentation

## 2018-09-21 DIAGNOSIS — Z510 Encounter for antineoplastic radiation therapy: Secondary | ICD-10-CM | POA: Diagnosis not present

## 2018-09-21 DIAGNOSIS — C773 Secondary and unspecified malignant neoplasm of axilla and upper limb lymph nodes: Secondary | ICD-10-CM | POA: Diagnosis not present

## 2018-09-21 DIAGNOSIS — I10 Essential (primary) hypertension: Secondary | ICD-10-CM | POA: Insufficient documentation

## 2018-09-21 DIAGNOSIS — Z803 Family history of malignant neoplasm of breast: Secondary | ICD-10-CM | POA: Insufficient documentation

## 2018-09-21 DIAGNOSIS — E039 Hypothyroidism, unspecified: Secondary | ICD-10-CM | POA: Insufficient documentation

## 2018-09-21 DIAGNOSIS — Z79890 Hormone replacement therapy (postmenopausal): Secondary | ICD-10-CM | POA: Diagnosis not present

## 2018-09-21 DIAGNOSIS — Z853 Personal history of malignant neoplasm of breast: Secondary | ICD-10-CM | POA: Diagnosis not present

## 2018-09-21 DIAGNOSIS — C4361 Malignant melanoma of right upper limb, including shoulder: Secondary | ICD-10-CM | POA: Diagnosis not present

## 2018-09-21 DIAGNOSIS — Z79899 Other long term (current) drug therapy: Secondary | ICD-10-CM | POA: Insufficient documentation

## 2018-09-22 ENCOUNTER — Encounter: Payer: Self-pay | Admitting: Oncology

## 2018-09-22 DIAGNOSIS — C4361 Malignant melanoma of right upper limb, including shoulder: Secondary | ICD-10-CM | POA: Diagnosis not present

## 2018-09-22 DIAGNOSIS — Z510 Encounter for antineoplastic radiation therapy: Secondary | ICD-10-CM | POA: Diagnosis not present

## 2018-09-24 ENCOUNTER — Other Ambulatory Visit: Payer: Self-pay | Admitting: *Deleted

## 2018-09-24 DIAGNOSIS — C4360 Malignant melanoma of unspecified upper limb, including shoulder: Secondary | ICD-10-CM

## 2018-09-27 ENCOUNTER — Other Ambulatory Visit: Payer: Self-pay

## 2018-09-28 ENCOUNTER — Other Ambulatory Visit: Payer: Self-pay

## 2018-09-28 ENCOUNTER — Ambulatory Visit
Admission: RE | Admit: 2018-09-28 | Discharge: 2018-09-28 | Disposition: A | Discharge status: Home / Self Care | Payer: PPO | Source: Ambulatory Visit | Attending: Radiation Oncology | Admitting: Radiation Oncology

## 2018-09-28 DIAGNOSIS — C4361 Malignant melanoma of right upper limb, including shoulder: Secondary | ICD-10-CM | POA: Diagnosis not present

## 2018-09-28 DIAGNOSIS — Z510 Encounter for antineoplastic radiation therapy: Secondary | ICD-10-CM | POA: Diagnosis not present

## 2018-09-29 ENCOUNTER — Other Ambulatory Visit: Payer: Self-pay

## 2018-09-29 ENCOUNTER — Ambulatory Visit
Admission: RE | Admit: 2018-09-29 | Discharge: 2018-09-29 | Disposition: A | Discharge status: Home / Self Care | Payer: PPO | Source: Ambulatory Visit | Attending: Radiation Oncology | Admitting: Radiation Oncology

## 2018-09-29 DIAGNOSIS — C4361 Malignant melanoma of right upper limb, including shoulder: Secondary | ICD-10-CM | POA: Diagnosis not present

## 2018-09-29 DIAGNOSIS — Z510 Encounter for antineoplastic radiation therapy: Secondary | ICD-10-CM | POA: Diagnosis not present

## 2018-09-30 ENCOUNTER — Ambulatory Visit
Admission: RE | Admit: 2018-09-30 | Discharge: 2018-09-30 | Disposition: A | Discharge status: Home / Self Care | Payer: PPO | Source: Ambulatory Visit | Attending: Radiation Oncology | Admitting: Radiation Oncology

## 2018-09-30 ENCOUNTER — Other Ambulatory Visit: Payer: Self-pay

## 2018-09-30 DIAGNOSIS — Z510 Encounter for antineoplastic radiation therapy: Secondary | ICD-10-CM | POA: Diagnosis not present

## 2018-09-30 DIAGNOSIS — C4361 Malignant melanoma of right upper limb, including shoulder: Secondary | ICD-10-CM | POA: Diagnosis not present

## 2018-10-01 ENCOUNTER — Other Ambulatory Visit: Payer: Self-pay

## 2018-10-01 ENCOUNTER — Ambulatory Visit
Admission: RE | Admit: 2018-10-01 | Discharge: 2018-10-01 | Disposition: A | Discharge status: Home / Self Care | Payer: PPO | Source: Ambulatory Visit | Attending: Radiation Oncology | Admitting: Radiation Oncology

## 2018-10-01 DIAGNOSIS — Z510 Encounter for antineoplastic radiation therapy: Secondary | ICD-10-CM | POA: Diagnosis not present

## 2018-10-01 DIAGNOSIS — C4361 Malignant melanoma of right upper limb, including shoulder: Secondary | ICD-10-CM | POA: Diagnosis not present

## 2018-10-04 ENCOUNTER — Ambulatory Visit (INDEPENDENT_AMBULATORY_CARE_PROVIDER_SITE_OTHER): Payer: PPO | Admitting: Family Medicine

## 2018-10-04 ENCOUNTER — Other Ambulatory Visit: Payer: Self-pay

## 2018-10-04 ENCOUNTER — Ambulatory Visit
Admission: RE | Admit: 2018-10-04 | Discharge: 2018-10-04 | Disposition: A | Discharge status: Home / Self Care | Payer: PPO | Source: Ambulatory Visit | Attending: Radiation Oncology | Admitting: Radiation Oncology

## 2018-10-04 ENCOUNTER — Encounter: Payer: Self-pay | Admitting: Family Medicine

## 2018-10-04 VITALS — BP 128/82 | HR 68 | Temp 98.4°F | Resp 16 | Ht 62.0 in | Wt 136.0 lb

## 2018-10-04 DIAGNOSIS — Z510 Encounter for antineoplastic radiation therapy: Secondary | ICD-10-CM | POA: Diagnosis not present

## 2018-10-04 DIAGNOSIS — Z853 Personal history of malignant neoplasm of breast: Secondary | ICD-10-CM

## 2018-10-04 DIAGNOSIS — C4361 Malignant melanoma of right upper limb, including shoulder: Secondary | ICD-10-CM

## 2018-10-04 DIAGNOSIS — R591 Generalized enlarged lymph nodes: Secondary | ICD-10-CM

## 2018-10-04 DIAGNOSIS — R739 Hyperglycemia, unspecified: Secondary | ICD-10-CM | POA: Diagnosis not present

## 2018-10-04 DIAGNOSIS — E785 Hyperlipidemia, unspecified: Secondary | ICD-10-CM

## 2018-10-04 DIAGNOSIS — E038 Other specified hypothyroidism: Secondary | ICD-10-CM

## 2018-10-04 DIAGNOSIS — I10 Essential (primary) hypertension: Secondary | ICD-10-CM

## 2018-10-04 NOTE — Progress Notes (Signed)
Patient: Katie Keith Female    DOB: December 02, 1945   73 y.o.   MRN: [id number] Visit Date: 10/04/2018  Today's Provider: Wilhemena Durie, MD   Chief Complaint  Patient presents with  . Follow-up  . Hypothyroidism  . Shoulder Pain   Subjective:     HPI  Patient comes in today for a 6 month follow up. She feels well today with no complaints. Her medications were no changed since last visit.  She is receiving treatment for malignant melanoma found by right axillary mass.  She is feeling well.  That scan was normal.  There is a small pituitary mass it is found that is of unknown etiology. Emotionally she is doing well despite the cancer and the coronavirus pandemic going on.  Appetite and energy are good. Wt Readings from Last 3 Encounters:  10/04/18 136 lb (61.7 kg)  09/20/18 138 lb 0.1 oz (62.6 kg)  09/06/18 135 lb 8 oz (61.5 kg)   BP Readings from Last 3 Encounters:  10/04/18 128/82  09/20/18 (!) 143/89  09/06/18 132/84   Patient is currently taking levothyroxine 155mcg. She reports good compliance and good symptom control. Lab Results  Component Value Date   TSH 0.720 09/08/2017   She also mentions that she has had pain in her left shoulder X 1 week. She reports that   Allergies  Allergen Reactions  . Penicillins Swelling and Other (See Comments)    Has patient had a PCN reaction causing immediate rash, facial/tongue/throat swelling, SOB or lightheadedness with hypotension: Yes Has patient had a PCN reaction causing severe rash involving mucus membranes or skin necrosis: No Has patient had a PCN reaction that required hospitalization: No Has patient had a PCN reaction occurring within the last 10 years: No If all of the above answers are "NO", then may proceed with Cephalosporin use.   . Sulfa Antibiotics Swelling and Rash    swelling     Current Outpatient Medications:  .  acetaminophen (TYLENOL) 325 MG tablet, Take 325-975 mg by mouth every 6 (six)  hours as needed for moderate pain or headache., Disp: , Rfl:  .  alendronate (FOSAMAX) 70 MG tablet, Take 1 tablet (70 mg total) by mouth every 7 (seven) days. Take with a full glass of water on an empty stomach., Disp: 4 tablet, Rfl: 11 .  Artificial Tear Ointment (DRY EYES OP), Apply 1 drop to eye as needed (dry eyes). , Disp: , Rfl:  .  aspirin 81 MG tablet, Take 81 mg by mouth daily. In the evening, Disp: , Rfl:  .  Biotin 5000 MCG CAPS, Take 5,000 mcg by mouth daily. , Disp: , Rfl:  .  Calcium Carbonate-Vitamin D (CALCIUM + D PO), Take 1 tablet by mouth daily. In the morning, Disp: , Rfl:  .  carvedilol (COREG) 6.25 MG tablet, TAKE 1 TABLET BY MOUTH TWICE DAILY, Disp: 60 tablet, Rfl: 11 .  CINNAMON PO, Take 1 capsule by mouth daily. In the morning, Disp: , Rfl:  .  cyanocobalamin (,VITAMIN B-12,) 1000 MCG/ML injection, Inject 1000 mcg IM every 2 weeks (Patient taking differently: Inject 1,000 mcg into the muscle every 14 (fourteen) days. Inject 1000 mcg IM every 2 weeks), Disp: 10 mL, Rfl: 3 .  ibuprofen (ADVIL,MOTRIN) 200 MG tablet, Take 200-400 mg by mouth every 6 (six) hours as needed for headache or moderate pain., Disp: , Rfl:  .  levothyroxine (SYNTHROID, LEVOTHROID) 112 MCG tablet, TAKE 1 TABLET  BY MOUTH ONCE DAILY BEFORE BREAKFAST (Patient taking differently: Take 112 mcg by mouth daily before breakfast. ), Disp: 60 tablet, Rfl: 6 .  NEEDLE, DISP, 25 G 25G X 1" MISC, Inject B12 twice a month, Disp: 10 each, Rfl: 12 .  Omega-3 Fatty Acids (FISH OIL) 1000 MG CAPS, Take 1,000 mg by mouth daily. In the morning, Disp: , Rfl:  .  OVER THE COUNTER MEDICATION, Take 1 capsule by mouth 2 (two) times daily. Alphoric Acid, Disp: , Rfl:  .  sodium chloride (OCEAN) 0.65 % SOLN nasal spray, Place 1 spray into both nostrils as needed for congestion., Disp: , Rfl:  .  Syringe, Disposable, 3 ML MISC, Use to inject B12 twice a month, Disp: 25 each, Rfl: 12  Review of Systems  Constitutional: Negative for  activity change, appetite change, diaphoresis and fatigue.  Respiratory: Negative for cough and shortness of breath.   Cardiovascular: Negative.   Gastrointestinal: Negative.   Endocrine: Negative.   Musculoskeletal: Negative for arthralgias.  Allergic/Immunologic: Negative.   Neurological: Negative for dizziness and headaches.  Psychiatric/Behavioral: Negative.     Social History   Tobacco Use  . Smoking status: Never Smoker  . Smokeless tobacco: Never Used  Substance Use Topics  . Alcohol use: No      Objective:   BP 128/82 (BP Location: Left Arm, Patient Position: Sitting, Cuff Size: Normal)   Pulse 68   Temp 98.4 F (36.9 C)   Resp 16   Ht 5\' 2"  (1.575 m)   Wt 136 lb (61.7 kg)   SpO2 98%   BMI 24.87 kg/m  Vitals:   10/04/18 0837  BP: 128/82  Pulse: 68  Resp: 16  Temp: 98.4 F (36.9 C)  SpO2: 98%  Weight: 136 lb (61.7 kg)  Height: 5\' 2"  (1.575 m)     Physical Exam Vitals signs reviewed.  Constitutional:      Appearance: Normal appearance.  HENT:     Head: Normocephalic and atraumatic.     Right Ear: External ear normal.     Left Ear: External ear normal.  Eyes:     General: No scleral icterus. Cardiovascular:     Rate and Rhythm: Normal rate and regular rhythm.     Heart sounds: Normal heart sounds.  Pulmonary:     Effort: Pulmonary effort is normal.     Breath sounds: Normal breath sounds.  Musculoskeletal:     Right lower leg: No edema.     Left lower leg: No edema.  Skin:    General: Skin is warm and dry.  Neurological:     General: No focal deficit present.     Mental Status: She is alert.  Psychiatric:        Mood and Affect: Mood normal.        Behavior: Behavior normal.        Thought Content: Thought content normal.        Judgment: Judgment normal.         Assessment & Plan    1. Essential hypertension I will see her back late this summer. 2. Hyperglycemia  - Hemoglobin A1c  3. Other specified hypothyroidism  - TSH   4. Lymphadenopathy   5. Hyperlipidemia, unspecified hyperlipidemia type  - Lipid panel  6. Malignant melanoma of right upper extremity including shoulder (HCC) Receiving chemotherapy and radiation therapy.  Doing very well.  7. History of breast cancer     I have done the exam and reviewed the  above chart and it is accurate to the best of my knowledge. Development worker, community has been used in this note in any air is in the dictation or transcription are unintentional.  Wilhemena Durie, MD  [place]

## 2018-10-05 ENCOUNTER — Ambulatory Visit
Admission: RE | Admit: 2018-10-05 | Discharge: 2018-10-05 | Disposition: A | Discharge status: Home / Self Care | Payer: PPO | Source: Ambulatory Visit | Attending: Radiation Oncology | Admitting: Radiation Oncology

## 2018-10-05 ENCOUNTER — Telehealth: Payer: Self-pay

## 2018-10-05 ENCOUNTER — Other Ambulatory Visit: Payer: Self-pay

## 2018-10-05 DIAGNOSIS — Z510 Encounter for antineoplastic radiation therapy: Secondary | ICD-10-CM | POA: Diagnosis not present

## 2018-10-05 DIAGNOSIS — C4361 Malignant melanoma of right upper limb, including shoulder: Secondary | ICD-10-CM | POA: Diagnosis not present

## 2018-10-05 LAB — LIPID PANEL
Chol/HDL Ratio: 5.3 ratio — ABNORMAL HIGH (ref 0.0–4.4)
Cholesterol, Total: 191 mg/dL (ref 100–199)
HDL: 36 mg/dL — ABNORMAL LOW (ref >39)
LDL Calculated: 120 mg/dL — ABNORMAL HIGH (ref 0–99)
Triglycerides: 173 mg/dL — ABNORMAL HIGH (ref 0–149)
VLDL Cholesterol Cal: 35 mg/dL (ref 5–40)

## 2018-10-05 LAB — HEMOGLOBIN A1C
Est. average glucose Bld gHb Est-mCnc: 117 mg/dL
Hgb A1c MFr Bld: 5.7 % — ABNORMAL HIGH (ref 4.8–5.6)

## 2018-10-05 LAB — TSH: TSH: 2 u[IU]/mL (ref 0.450–4.500)

## 2018-10-05 NOTE — Telephone Encounter (Signed)
-----   Message from Jerrol Banana., MD sent at 10/05/2018  8:12 AM EDT ----- Labs stable.

## 2018-10-05 NOTE — Telephone Encounter (Signed)
Patient was advised.  

## 2018-10-06 ENCOUNTER — Other Ambulatory Visit: Payer: Self-pay

## 2018-10-06 ENCOUNTER — Ambulatory Visit
Admission: RE | Admit: 2018-10-06 | Discharge: 2018-10-06 | Disposition: A | Discharge status: Home / Self Care | Payer: PPO | Source: Ambulatory Visit | Attending: Radiation Oncology | Admitting: Radiation Oncology

## 2018-10-06 DIAGNOSIS — Z510 Encounter for antineoplastic radiation therapy: Secondary | ICD-10-CM | POA: Diagnosis not present

## 2018-10-06 DIAGNOSIS — C4361 Malignant melanoma of right upper limb, including shoulder: Secondary | ICD-10-CM | POA: Diagnosis not present

## 2018-10-07 ENCOUNTER — Ambulatory Visit
Admission: RE | Admit: 2018-10-07 | Discharge: 2018-10-07 | Disposition: A | Discharge status: Home / Self Care | Payer: PPO | Source: Ambulatory Visit | Attending: Radiation Oncology | Admitting: Radiation Oncology

## 2018-10-07 ENCOUNTER — Encounter: Payer: Self-pay | Admitting: General Surgery

## 2018-10-07 ENCOUNTER — Other Ambulatory Visit: Payer: Self-pay

## 2018-10-07 DIAGNOSIS — C4361 Malignant melanoma of right upper limb, including shoulder: Secondary | ICD-10-CM | POA: Diagnosis not present

## 2018-10-07 DIAGNOSIS — Z510 Encounter for antineoplastic radiation therapy: Secondary | ICD-10-CM | POA: Diagnosis not present

## 2018-10-08 ENCOUNTER — Other Ambulatory Visit: Payer: Self-pay

## 2018-10-08 ENCOUNTER — Ambulatory Visit
Admission: RE | Admit: 2018-10-08 | Discharge: 2018-10-08 | Disposition: A | Discharge status: Home / Self Care | Payer: PPO | Source: Ambulatory Visit | Attending: Radiation Oncology | Admitting: Radiation Oncology

## 2018-10-08 DIAGNOSIS — C773 Secondary and unspecified malignant neoplasm of axilla and upper limb lymph nodes: Secondary | ICD-10-CM | POA: Insufficient documentation

## 2018-10-08 DIAGNOSIS — I10 Essential (primary) hypertension: Secondary | ICD-10-CM | POA: Insufficient documentation

## 2018-10-08 DIAGNOSIS — Z79899 Other long term (current) drug therapy: Secondary | ICD-10-CM | POA: Diagnosis not present

## 2018-10-08 DIAGNOSIS — Z510 Encounter for antineoplastic radiation therapy: Secondary | ICD-10-CM | POA: Insufficient documentation

## 2018-10-08 DIAGNOSIS — E039 Hypothyroidism, unspecified: Secondary | ICD-10-CM | POA: Insufficient documentation

## 2018-10-08 DIAGNOSIS — Z853 Personal history of malignant neoplasm of breast: Secondary | ICD-10-CM | POA: Diagnosis not present

## 2018-10-08 DIAGNOSIS — Z7983 Long term (current) use of bisphosphonates: Secondary | ICD-10-CM | POA: Insufficient documentation

## 2018-10-08 DIAGNOSIS — Z803 Family history of malignant neoplasm of breast: Secondary | ICD-10-CM | POA: Insufficient documentation

## 2018-10-08 DIAGNOSIS — C801 Malignant (primary) neoplasm, unspecified: Secondary | ICD-10-CM | POA: Insufficient documentation

## 2018-10-08 DIAGNOSIS — Z79890 Hormone replacement therapy (postmenopausal): Secondary | ICD-10-CM | POA: Diagnosis not present

## 2018-10-11 ENCOUNTER — Ambulatory Visit
Admission: RE | Admit: 2018-10-11 | Discharge: 2018-10-11 | Disposition: A | Discharge status: Home / Self Care | Payer: PPO | Source: Ambulatory Visit | Attending: Radiation Oncology | Admitting: Radiation Oncology

## 2018-10-11 ENCOUNTER — Other Ambulatory Visit: Payer: Self-pay

## 2018-10-11 DIAGNOSIS — Z510 Encounter for antineoplastic radiation therapy: Secondary | ICD-10-CM | POA: Diagnosis not present

## 2018-10-11 DIAGNOSIS — C4361 Malignant melanoma of right upper limb, including shoulder: Secondary | ICD-10-CM | POA: Diagnosis not present

## 2018-10-12 ENCOUNTER — Other Ambulatory Visit: Payer: Self-pay

## 2018-10-12 ENCOUNTER — Ambulatory Visit
Admission: RE | Admit: 2018-10-12 | Discharge: 2018-10-12 | Disposition: A | Discharge status: Home / Self Care | Payer: PPO | Source: Ambulatory Visit | Attending: Radiation Oncology | Admitting: Radiation Oncology

## 2018-10-12 DIAGNOSIS — Z510 Encounter for antineoplastic radiation therapy: Secondary | ICD-10-CM | POA: Diagnosis not present

## 2018-10-12 DIAGNOSIS — C4361 Malignant melanoma of right upper limb, including shoulder: Secondary | ICD-10-CM | POA: Diagnosis not present

## 2018-10-13 ENCOUNTER — Other Ambulatory Visit: Payer: Self-pay

## 2018-10-13 ENCOUNTER — Ambulatory Visit
Admission: RE | Admit: 2018-10-13 | Discharge: 2018-10-13 | Disposition: A | Discharge status: Home / Self Care | Payer: PPO | Source: Ambulatory Visit | Attending: Radiation Oncology | Admitting: Radiation Oncology

## 2018-10-13 ENCOUNTER — Inpatient Hospital Stay: Payer: PPO | Attending: Radiation Oncology

## 2018-10-13 DIAGNOSIS — Z510 Encounter for antineoplastic radiation therapy: Secondary | ICD-10-CM | POA: Diagnosis not present

## 2018-10-13 DIAGNOSIS — C773 Secondary and unspecified malignant neoplasm of axilla and upper limb lymph nodes: Secondary | ICD-10-CM | POA: Insufficient documentation

## 2018-10-13 DIAGNOSIS — C4360 Malignant melanoma of unspecified upper limb, including shoulder: Secondary | ICD-10-CM

## 2018-10-13 DIAGNOSIS — C439 Malignant melanoma of skin, unspecified: Secondary | ICD-10-CM | POA: Insufficient documentation

## 2018-10-13 LAB — CBC
HCT: 40.7 % (ref 36.0–46.0)
Hemoglobin: 12.7 g/dL (ref 12.0–15.0)
MCH: 26.6 pg (ref 26.0–34.0)
MCHC: 31.2 g/dL (ref 30.0–36.0)
MCV: 85.1 fL (ref 80.0–100.0)
Platelets: 244 10*3/uL (ref 150–400)
RBC: 4.78 MIL/uL (ref 3.87–5.11)
RDW: 15.1 % (ref 11.5–15.5)
WBC: 8.5 10*3/uL (ref 4.0–10.5)
nRBC: 0 % (ref 0.0–0.2)

## 2018-10-14 ENCOUNTER — Ambulatory Visit
Admission: RE | Admit: 2018-10-14 | Discharge: 2018-10-14 | Disposition: A | Discharge status: Home / Self Care | Payer: PPO | Source: Ambulatory Visit | Attending: Radiation Oncology | Admitting: Radiation Oncology

## 2018-10-14 ENCOUNTER — Other Ambulatory Visit: Payer: Self-pay

## 2018-10-14 DIAGNOSIS — Z510 Encounter for antineoplastic radiation therapy: Secondary | ICD-10-CM | POA: Diagnosis not present

## 2018-10-14 DIAGNOSIS — C4361 Malignant melanoma of right upper limb, including shoulder: Secondary | ICD-10-CM | POA: Diagnosis not present

## 2018-10-15 ENCOUNTER — Ambulatory Visit
Admission: RE | Admit: 2018-10-15 | Discharge: 2018-10-15 | Disposition: A | Discharge status: Home / Self Care | Payer: PPO | Source: Ambulatory Visit | Attending: Radiation Oncology | Admitting: Radiation Oncology

## 2018-10-15 ENCOUNTER — Other Ambulatory Visit: Payer: Self-pay

## 2018-10-15 DIAGNOSIS — Z510 Encounter for antineoplastic radiation therapy: Secondary | ICD-10-CM | POA: Diagnosis not present

## 2018-10-15 DIAGNOSIS — C4361 Malignant melanoma of right upper limb, including shoulder: Secondary | ICD-10-CM | POA: Diagnosis not present

## 2018-10-18 ENCOUNTER — Ambulatory Visit
Admission: RE | Admit: 2018-10-18 | Discharge: 2018-10-18 | Disposition: A | Discharge status: Home / Self Care | Payer: PPO | Source: Ambulatory Visit | Attending: Radiation Oncology | Admitting: Radiation Oncology

## 2018-10-18 ENCOUNTER — Other Ambulatory Visit: Payer: Self-pay

## 2018-10-18 DIAGNOSIS — Z510 Encounter for antineoplastic radiation therapy: Secondary | ICD-10-CM | POA: Diagnosis not present

## 2018-10-18 DIAGNOSIS — C4361 Malignant melanoma of right upper limb, including shoulder: Secondary | ICD-10-CM | POA: Diagnosis not present

## 2018-10-19 ENCOUNTER — Other Ambulatory Visit: Payer: Self-pay

## 2018-10-19 ENCOUNTER — Ambulatory Visit
Admission: RE | Admit: 2018-10-19 | Discharge: 2018-10-19 | Disposition: A | Discharge status: Home / Self Care | Payer: PPO | Source: Ambulatory Visit | Attending: Radiation Oncology | Admitting: Radiation Oncology

## 2018-10-19 DIAGNOSIS — C4361 Malignant melanoma of right upper limb, including shoulder: Secondary | ICD-10-CM | POA: Diagnosis not present

## 2018-10-19 DIAGNOSIS — Z510 Encounter for antineoplastic radiation therapy: Secondary | ICD-10-CM | POA: Diagnosis not present

## 2018-10-20 ENCOUNTER — Other Ambulatory Visit: Payer: Self-pay

## 2018-10-20 ENCOUNTER — Ambulatory Visit
Admission: RE | Admit: 2018-10-20 | Discharge: 2018-10-20 | Disposition: A | Discharge status: Home / Self Care | Payer: PPO | Source: Ambulatory Visit | Attending: Radiation Oncology | Admitting: Radiation Oncology

## 2018-10-20 DIAGNOSIS — C4361 Malignant melanoma of right upper limb, including shoulder: Secondary | ICD-10-CM | POA: Diagnosis not present

## 2018-10-20 DIAGNOSIS — Z510 Encounter for antineoplastic radiation therapy: Secondary | ICD-10-CM | POA: Diagnosis not present

## 2018-10-21 ENCOUNTER — Other Ambulatory Visit: Payer: Self-pay

## 2018-10-21 ENCOUNTER — Ambulatory Visit
Admission: RE | Admit: 2018-10-21 | Discharge: 2018-10-21 | Disposition: A | Discharge status: Home / Self Care | Payer: PPO | Source: Ambulatory Visit | Attending: Radiation Oncology | Admitting: Radiation Oncology

## 2018-10-21 DIAGNOSIS — C4361 Malignant melanoma of right upper limb, including shoulder: Secondary | ICD-10-CM | POA: Diagnosis not present

## 2018-10-21 DIAGNOSIS — Z510 Encounter for antineoplastic radiation therapy: Secondary | ICD-10-CM | POA: Diagnosis not present

## 2018-10-22 ENCOUNTER — Ambulatory Visit
Admission: RE | Admit: 2018-10-22 | Discharge: 2018-10-22 | Disposition: A | Discharge status: Home / Self Care | Payer: PPO | Source: Ambulatory Visit | Attending: Radiation Oncology | Admitting: Radiation Oncology

## 2018-10-22 ENCOUNTER — Other Ambulatory Visit: Payer: Self-pay

## 2018-10-22 DIAGNOSIS — C4361 Malignant melanoma of right upper limb, including shoulder: Secondary | ICD-10-CM | POA: Diagnosis not present

## 2018-10-22 DIAGNOSIS — Z510 Encounter for antineoplastic radiation therapy: Secondary | ICD-10-CM | POA: Diagnosis not present

## 2018-10-25 ENCOUNTER — Ambulatory Visit
Admission: RE | Admit: 2018-10-25 | Discharge: 2018-10-25 | Disposition: A | Discharge status: Home / Self Care | Payer: PPO | Source: Ambulatory Visit | Attending: Radiation Oncology | Admitting: Radiation Oncology

## 2018-10-25 ENCOUNTER — Other Ambulatory Visit: Payer: Self-pay

## 2018-10-25 DIAGNOSIS — Z510 Encounter for antineoplastic radiation therapy: Secondary | ICD-10-CM | POA: Diagnosis not present

## 2018-10-25 DIAGNOSIS — C4361 Malignant melanoma of right upper limb, including shoulder: Secondary | ICD-10-CM | POA: Diagnosis not present

## 2018-10-26 ENCOUNTER — Other Ambulatory Visit: Payer: Self-pay

## 2018-10-26 ENCOUNTER — Ambulatory Visit
Admission: RE | Admit: 2018-10-26 | Discharge: 2018-10-26 | Disposition: A | Discharge status: Home / Self Care | Payer: PPO | Source: Ambulatory Visit | Attending: Radiation Oncology | Admitting: Radiation Oncology

## 2018-10-26 DIAGNOSIS — Z510 Encounter for antineoplastic radiation therapy: Secondary | ICD-10-CM | POA: Diagnosis not present

## 2018-10-26 DIAGNOSIS — C4361 Malignant melanoma of right upper limb, including shoulder: Secondary | ICD-10-CM | POA: Diagnosis not present

## 2018-11-02 ENCOUNTER — Ambulatory Visit (INDEPENDENT_AMBULATORY_CARE_PROVIDER_SITE_OTHER): Payer: PPO | Admitting: General Surgery

## 2018-11-02 ENCOUNTER — Other Ambulatory Visit: Payer: Self-pay | Admitting: *Deleted

## 2018-11-02 ENCOUNTER — Other Ambulatory Visit: Payer: Self-pay | Admitting: Oncology

## 2018-11-02 ENCOUNTER — Encounter: Payer: Self-pay | Admitting: General Surgery

## 2018-11-02 ENCOUNTER — Other Ambulatory Visit: Payer: Self-pay

## 2018-11-02 VITALS — BP 169/96 | HR 75 | Temp 97.7°F | Ht 62.0 in | Wt 140.0 lb

## 2018-11-02 DIAGNOSIS — E237 Disorder of pituitary gland, unspecified: Secondary | ICD-10-CM

## 2018-11-02 DIAGNOSIS — R0789 Other chest pain: Secondary | ICD-10-CM

## 2018-11-02 MED ORDER — PREDNISONE 10 MG (21) PO TBPK: ORAL_TABLET | ORAL | 0 refills | Status: DC

## 2018-11-02 MED ORDER — LANSOPRAZOLE 30 MG PO CPDR: 30.0000 mg | DELAYED_RELEASE_CAPSULE | Freq: Every day | ORAL | 11 refills | Status: AC

## 2018-11-02 NOTE — Progress Notes (Signed)
Patient ID: Katie Keith, female   DOB: [birth year], 73 y.o.   MRN: [id number]  Chief Complaint  Patient presents with  . Follow-up    HPI Katie Keith is a 73 y.o. female here today for back pain and indigestion for at least 2 weeks.  She admits to upper back pain that extends to the front. She admits to "pressure" central chest. She has used a heating pad and ice pack. She is using Rolaids which helps her burp.This relieves some of her discomfort in the chest area.  No trouble swallowing.    HPI  Past Medical History:  Diagnosis Date  . Anemia    b12 injections twice a month  . Breast cancer ([place]) 1992   LT LUMPECTOMY DCIS  . Cancer ([place]) 1992   DCIS left breast, treated with wide excision.  . Complication of anesthesia   . Diverticulosis 2006  . Enlarged lymph nodes in armpit 08/2018  . GERD (gastroesophageal reflux disease)   . Hypertension   . Hypothyroidism   . Melanoma ([place]) 08/2018  . Migraines   . Peripheral neuropathy    feet  . Pituitary mass ([place]) 08/2018   right  . PONV (postoperative nausea and vomiting)   . Thyroid disease     Past Surgical History:  Procedure Laterality Date  . AXILLARY LYMPH NODE BIOPSY Right 08/24/2018   Procedure: AXILLARY LYMPH NODE BIOPSY;  Surgeon: Robert Bellow, MD;  Location: [facility] ORS;  Service: General;  Laterality: Right;  . AXILLARY LYMPH NODE BIOPSY Bilateral 09/06/2018   Procedure: AXILLARY LYMPH NODE BIOPSY LEFT;  Surgeon: Robert Bellow, MD;  Location: [facility] ORS;  Service: General;  Laterality: Bilateral;  . AXILLARY LYMPH NODE DISSECTION Bilateral 09/06/2018   Procedure: AXILLARY LYMPH NODE DISSECTION RIGHT;  Surgeon: Robert Bellow, MD;  Location: [facility] ORS;  Service: General;  Laterality: Bilateral;  . BREAST BIOPSY Left 03/21/2016   2 areas done by Benedicto Capozzi fibrocystic changes  . BREAST LUMPECTOMY Left 1993   DCIS, no chemo no radation  . BREAST SURGERY Left 1992   wide excision for DCIS  . CATARACT  EXTRACTION W/PHACO Left 08/04/2017   Procedure: CATARACT EXTRACTION PHACO AND INTRAOCULAR LENS PLACEMENT (IOC);  Surgeon: Birder Robson, MD;  Location: [facility] ORS;  Service: Ophthalmology;  Laterality: Left;  [place] 00:40.7 AP% 14.6 CDE 5.97 Fluid Pack Lot # [id number]  . CATARACT EXTRACTION W/PHACO Right 08/18/2017   Procedure: CATARACT EXTRACTION PHACO AND INTRAOCULAR LENS PLACEMENT ([place]);  Surgeon: Birder Robson, MD;  Location: [facility] ORS;  Service: Ophthalmology;  Laterality: Right;  [place] 00:51.9 AP% 17.1 CDE 8.84 Fluid Pack Lot # [id number]  . COLONOSCOPY  2006   Dr Bary Castilla  . COLONOSCOPY WITH PROPOFOL N/A 04/18/2015   Procedure: COLONOSCOPY WITH PROPOFOL;  Surgeon: Robert Bellow, MD;  Location: [facility] ENDOSCOPY;  Service: Endoscopy;  Laterality: N/A;  . HERNIA REPAIR    . thumb surgery     injury with infection    Family History  Problem Relation Age of Onset  . Breast cancer Mother        58'S  . Diabetes Mother   . Lung cancer Father   . Cancer Sister 56       breast  . Breast cancer Sister 47  . Breast cancer Paternal Grandmother        70'S  . Diverticulitis Son        part of colon removed  . Neuropathy Son   . Diabetes Sister   .  Hypertension Sister   . Neuropathy Son   . Diverticulitis Son     Social History Social History   Tobacco Use  . Smoking status: Never Smoker  . Smokeless tobacco: Never Used  Substance Use Topics  . Alcohol use: No  . Drug use: No    Allergies  Allergen Reactions  . Penicillins Swelling and Other (See Comments)    Has patient had a PCN reaction causing immediate rash, facial/tongue/throat swelling, SOB or lightheadedness with hypotension: Yes Has patient had a PCN reaction causing severe rash involving mucus membranes or skin necrosis: No Has patient had a PCN reaction that required hospitalization: No Has patient had a PCN reaction occurring within the last 10 years: No If all of the above answers are "NO", then may proceed  with Cephalosporin use.   . Sulfa Antibiotics Swelling and Rash    swelling    Current Outpatient Medications  Medication Sig Dispense Refill  . acetaminophen (TYLENOL) 325 MG tablet Take 325-975 mg by mouth every 6 (six) hours as needed for moderate pain or headache.    . alendronate (FOSAMAX) 70 MG tablet Take 1 tablet (70 mg total) by mouth every 7 (seven) days. Take with a full glass of water on an empty stomach. 4 tablet 11  . Artificial Tear Ointment (DRY EYES OP) Apply 1 drop to eye as needed (dry eyes).     Marland Kitchen aspirin 81 MG tablet Take 81 mg by mouth daily. In the evening    . Biotin 5000 MCG CAPS Take 5,000 mcg by mouth daily.     . Calcium Carbonate-Vitamin D (CALCIUM + D PO) Take 1 tablet by mouth daily. In the morning    . carvedilol (COREG) 6.25 MG tablet TAKE 1 TABLET BY MOUTH TWICE DAILY 60 tablet 11  . CINNAMON PO Take 1 capsule by mouth daily. In the morning    . cyanocobalamin (,VITAMIN B-12,) 1000 MCG/ML injection Inject 1000 mcg IM every 2 weeks (Patient taking differently: Inject 1,000 mcg into the muscle every 14 (fourteen) days. Inject 1000 mcg IM every 2 weeks) 10 mL 3  . ibuprofen (ADVIL,MOTRIN) 200 MG tablet Take 200-400 mg by mouth every 6 (six) hours as needed for headache or moderate pain.    Marland Kitchen lansoprazole (PREVACID) 30 MG capsule Take 1 capsule (30 mg total) by mouth daily at 12 noon. 30 capsule 11  . levothyroxine (SYNTHROID, LEVOTHROID) 112 MCG tablet TAKE 1 TABLET BY MOUTH ONCE DAILY BEFORE BREAKFAST (Patient taking differently: Take 112 mcg by mouth daily before breakfast. ) 60 tablet 6  . NEEDLE, DISP, 25 G 25G X 1" MISC Inject B12 twice a month 10 each 12  . Omega-3 Fatty Acids (FISH OIL) 1000 MG CAPS Take 1,000 mg by mouth daily. In the morning    . OVER THE COUNTER MEDICATION Take 1 capsule by mouth 2 (two) times daily. Alphoric Acid    . predniSONE (STERAPRED UNI-PAK 21 TAB) 10 MG (21) TBPK tablet Decrease by one tablet daily 21 tablet 0  . sodium  chloride (OCEAN) 0.65 % SOLN nasal spray Place 1 spray into both nostrils as needed for congestion.    . Syringe, Disposable, 3 ML MISC Use to inject B12 twice a month 25 each 12   No current facility-administered medications for this visit.     Review of Systems Review of Systems  Blood pressure (!) 169/96, pulse 75, temperature 97.7 F (36.5 C), temperature source Skin, height 5\' 2"  (1.575 m), weight 140  lb (63.5 kg), SpO2 98 %.  Physical Exam Physical Exam Exam conducted with a chaperone present.  Constitutional:      Appearance: She is well-developed.  Eyes:     General: No scleral icterus.    Conjunctiva/sclera: Conjunctivae normal.  Neck:     Musculoskeletal: Neck supple.  Cardiovascular:     Rate and Rhythm: Normal rate and regular rhythm.     Heart sounds: Normal heart sounds.  Pulmonary:     Effort: Pulmonary effort is normal.     Breath sounds: Normal breath sounds.    Chest:     Breasts:        Right: Normal.        Comments: Right chest rash and right back rash Abdominal:     Tenderness: There is abdominal tenderness in the epigastric area.  Lymphadenopathy:     Cervical: No cervical adenopathy.     Upper Body:     Right upper body: No supraclavicular or axillary adenopathy.  Skin:    General: Skin is warm and dry.  Neurological:     Mental Status: She is alert and oriented to person, place, and time.  Psychiatric:        Behavior: Behavior normal.     Data Reviewed Radiation notes  Assessment Chest wall pain secondary to recently completed radiation therapy.   GERD  Plan Short course of steroids for management of inflammatory changes related to the radiation.  Prevacid as prescribed by radiation oncology.   Phone follow up in three days.   HPI, Physical Exam, Assessment and Plan have been scribed under the direction and in the presence of Hervey Ard, MD.  Gaspar Cola, CMA' HPI, assessment, plan and physical exam has been  scribed under the direction and in the presence of Robert Bellow, MD. Karie Fetch, RN  I have completed the exam and reviewed the above documentation for accuracy and completeness.  I agree with the above.  Haematologist has been used and any errors in dictation or transcription are unintentional.  Hervey Ard, M.D., F.A.C.S.   Forest Gleason Danyetta Gillham 11/03/2018, 7:20 AM

## 2018-11-02 NOTE — Patient Instructions (Addendum)
Prednisone taper Prevacid as ordered The patient is aware to call back for any questions or new concerns. Call Friday with status report

## 2018-11-03 DIAGNOSIS — R0789 Other chest pain: Secondary | ICD-10-CM | POA: Insufficient documentation

## 2018-11-09 ENCOUNTER — Encounter: Payer: Self-pay | Admitting: Nurse Practitioner

## 2018-11-09 NOTE — Progress Notes (Signed)
I have initiated appeal process for MRI brain with pituitary protocol. Imaging was refused by her [employer] [place] insurance company. I will send notes from 3/25 and 4/13 office visit and imaging report from 3/24 along with letter of medical necessity to [place] at [phone] attn appeals.

## 2018-11-09 NOTE — Progress Notes (Signed)
Duplicate entered in error

## 2018-11-10 ENCOUNTER — Encounter: Payer: Self-pay | Admitting: General Surgery

## 2018-11-10 ENCOUNTER — Ambulatory Visit (INDEPENDENT_AMBULATORY_CARE_PROVIDER_SITE_OTHER): Payer: PPO | Admitting: General Surgery

## 2018-11-10 ENCOUNTER — Other Ambulatory Visit: Payer: Self-pay

## 2018-11-10 ENCOUNTER — Telehealth: Payer: Self-pay | Admitting: *Deleted

## 2018-11-10 VITALS — BP 136/84 | HR 79 | Temp 97.7°F | Wt 140.4 lb

## 2018-11-10 DIAGNOSIS — R0789 Other chest pain: Secondary | ICD-10-CM

## 2018-11-10 NOTE — Telephone Encounter (Signed)
Per Margreta Journey 11/10/18 staff message to cancel MRI scheduled for 11/11/18  due to awaiting appeal determination.  MRI was cancelled as requested. I called patient x2 and left a detailed message to make her aware.

## 2018-11-10 NOTE — Patient Instructions (Signed)
The patient is aware to call back for any questions or new concerns. May use neosporin ointment to the area

## 2018-11-10 NOTE — Progress Notes (Signed)
Patient ID: Katie Keith, female   DOB: 1946-04-15, 73 y.o.   MRN: [id number]  Chief Complaint  Patient presents with  . Follow-up    HPI Katie Keith is a 73 y.o. female.  Here for follow up. She states she noticed redness and irritation right axilla for about a week.  HPI  Past Medical History:  Diagnosis Date  . Anemia    b12 injections twice a month  . Breast cancer ([place]) 1992   LT LUMPECTOMY DCIS  . Cancer ([place]) 1992   DCIS left breast, treated with wide excision.  . Complication of anesthesia   . Diverticulosis 2006  . Enlarged lymph nodes in armpit 08/2018  . GERD (gastroesophageal reflux disease)   . Hypertension   . Hypothyroidism   . Melanoma ([place]) 08/2018  . Migraines   . Peripheral neuropathy    feet  . Pituitary mass ([place]) 08/2018   right  . PONV (postoperative nausea and vomiting)   . Thyroid disease     Past Surgical History:  Procedure Laterality Date  . AXILLARY LYMPH NODE BIOPSY Right 08/24/2018   Procedure: AXILLARY LYMPH NODE BIOPSY;  Surgeon: Robert Bellow, MD;  Location: [facility] ORS;  Service: General;  Laterality: Right;  . AXILLARY LYMPH NODE BIOPSY Bilateral 09/06/2018   Procedure: AXILLARY LYMPH NODE BIOPSY LEFT;  Surgeon: Robert Bellow, MD;  Location: [facility] ORS;  Service: General;  Laterality: Bilateral;  . AXILLARY LYMPH NODE DISSECTION Bilateral 09/06/2018   Procedure: AXILLARY LYMPH NODE DISSECTION RIGHT;  Surgeon: Robert Bellow, MD;  Location: [facility] ORS;  Service: General;  Laterality: Bilateral;  . BREAST BIOPSY Left 03/21/2016   2 areas done by byrnett fibrocystic changes  . BREAST LUMPECTOMY Left 1993   DCIS, no chemo no radation  . BREAST SURGERY Left 1992   wide excision for DCIS  . CATARACT EXTRACTION W/PHACO Left 08/04/2017   Procedure: CATARACT EXTRACTION PHACO AND INTRAOCULAR LENS PLACEMENT (IOC);  Surgeon: Birder Robson, MD;  Location: [facility] ORS;  Service: Ophthalmology;  Laterality: Left;  [place] 00:40.7 AP%  14.6 CDE 5.97 Fluid Pack Lot # [id number]  . CATARACT EXTRACTION W/PHACO Right 08/18/2017   Procedure: CATARACT EXTRACTION PHACO AND INTRAOCULAR LENS PLACEMENT ([place]);  Surgeon: Birder Robson, MD;  Location: [facility] ORS;  Service: Ophthalmology;  Laterality: Right;  [place] 00:51.9 AP% 17.1 CDE 8.84 Fluid Pack Lot # [id number]  . COLONOSCOPY  2006   Dr Bary Castilla  . COLONOSCOPY WITH PROPOFOL N/A 04/18/2015   Procedure: COLONOSCOPY WITH PROPOFOL;  Surgeon: Robert Bellow, MD;  Location: [facility] ENDOSCOPY;  Service: Endoscopy;  Laterality: N/A;  . HERNIA REPAIR    . thumb surgery     injury with infection    Family History  Problem Relation Age of Onset  . Breast cancer Mother        1'S  . Diabetes Mother   . Lung cancer Father   . Cancer Sister 76       breast  . Breast cancer Sister 51  . Breast cancer Paternal Grandmother        70'S  . Diverticulitis Son        part of colon removed  . Neuropathy Son   . Diabetes Sister   . Hypertension Sister   . Neuropathy Son   . Diverticulitis Son     Social History Social History   Tobacco Use  . Smoking status: Never Smoker  . Smokeless tobacco: Never Used  Substance Use Topics  .  Alcohol use: No  . Drug use: No    Allergies  Allergen Reactions  . Penicillins Swelling and Other (See Comments)    Has patient had a PCN reaction causing immediate rash, facial/tongue/throat swelling, SOB or lightheadedness with hypotension: Yes Has patient had a PCN reaction causing severe rash involving mucus membranes or skin necrosis: No Has patient had a PCN reaction that required hospitalization: No Has patient had a PCN reaction occurring within the last 10 years: No If all of the above answers are "NO", then may proceed with Cephalosporin use.   . Sulfa Antibiotics Swelling and Rash    swelling    Current Outpatient Medications  Medication Sig Dispense Refill  . acetaminophen (TYLENOL) 325 MG tablet Take 325-975 mg by mouth every 6 (six)  hours as needed for moderate pain or headache.    . alendronate (FOSAMAX) 70 MG tablet Take 1 tablet (70 mg total) by mouth every 7 (seven) days. Take with a full glass of water on an empty stomach. 4 tablet 11  . Artificial Tear Ointment (DRY EYES OP) Apply 1 drop to eye as needed (dry eyes).     Marland Kitchen aspirin 81 MG tablet Take 81 mg by mouth daily. In the evening    . Biotin 5000 MCG CAPS Take 5,000 mcg by mouth daily.     . Calcium Carbonate-Vitamin D (CALCIUM + D PO) Take 1 tablet by mouth daily. In the morning    . carvedilol (COREG) 6.25 MG tablet TAKE 1 TABLET BY MOUTH TWICE DAILY 60 tablet 11  . CINNAMON PO Take 1 capsule by mouth daily. In the morning    . cyanocobalamin (,VITAMIN B-12,) 1000 MCG/ML injection Inject 1000 mcg IM every 2 weeks (Patient taking differently: Inject 1,000 mcg into the muscle every 14 (fourteen) days. Inject 1000 mcg IM every 2 weeks) 10 mL 3  . ibuprofen (ADVIL,MOTRIN) 200 MG tablet Take 200-400 mg by mouth every 6 (six) hours as needed for headache or moderate pain.    Marland Kitchen lansoprazole (PREVACID) 30 MG capsule Take 1 capsule (30 mg total) by mouth daily at 12 noon. 30 capsule 11  . levothyroxine (SYNTHROID, LEVOTHROID) 112 MCG tablet TAKE 1 TABLET BY MOUTH ONCE DAILY BEFORE BREAKFAST (Patient taking differently: Take 112 mcg by mouth daily before breakfast. ) 60 tablet 6  . NEEDLE, DISP, 25 G 25G X 1" MISC Inject B12 twice a month 10 each 12  . Omega-3 Fatty Acids (FISH OIL) 1000 MG CAPS Take 1,000 mg by mouth daily. In the morning    . OVER THE COUNTER MEDICATION Take 1 capsule by mouth 2 (two) times daily. Alphoric Acid    . sodium chloride (OCEAN) 0.65 % SOLN nasal spray Place 1 spray into both nostrils as needed for congestion.    . Syringe, Disposable, 3 ML MISC Use to inject B12 twice a month 25 each 12   No current facility-administered medications for this visit.     Review of Systems Review of Systems  Constitutional: Negative.   Respiratory: Negative.    Cardiovascular: Negative.     Blood pressure 136/84, pulse 79, temperature 97.7 F (36.5 C), temperature source Temporal, weight 140 lb 6.4 oz (63.7 kg), SpO2 96 %.  Physical Exam Physical Exam Constitutional:      Appearance: Normal appearance.  Pulmonary:    Chest:    Skin:    General: Skin is warm and dry.  Neurological:     Mental Status: She is alert and oriented  to person, place, and time.  Psychiatric:        Behavior: Behavior normal.     Data Reviewed       Assessment Skin changes secondary to radiation treatment.   Plan  Patient will apply triple antibiotic ointment to the area after daily showers. Continue Zyrtec for anti-pruritic effects in radiation field.   HPI, assessment, plan and physical exam has been scribed under the direction and in the presence of Robert Bellow, MD. Karie Fetch, RN  I have completed the exam and reviewed the above documentation for accuracy and completeness.  I agree with the above.  Haematologist has been used and any errors in dictation or transcription are unintentional.  Hervey Ard, M.D., F.A.C.S.  Forest Gleason Byrnett 11/10/2018, 10:44 AM

## 2018-11-11 ENCOUNTER — Ambulatory Visit: Payer: PPO

## 2018-11-11 ENCOUNTER — Ambulatory Visit
Admission: RE | Admit: 2018-11-11 | Discharge: 2018-11-11 | Disposition: A | Discharge status: Home / Self Care | Payer: PPO | Source: Ambulatory Visit | Attending: Oncology | Admitting: Oncology

## 2018-11-11 ENCOUNTER — Other Ambulatory Visit: Payer: Self-pay | Admitting: Oncology

## 2018-11-11 DIAGNOSIS — E237 Disorder of pituitary gland, unspecified: Secondary | ICD-10-CM | POA: Diagnosis not present

## 2018-11-11 DIAGNOSIS — C439 Malignant melanoma of skin, unspecified: Secondary | ICD-10-CM

## 2018-11-11 DIAGNOSIS — E236 Other disorders of pituitary gland: Secondary | ICD-10-CM | POA: Diagnosis not present

## 2018-11-11 LAB — POCT I-STAT CREATININE: Creatinine, Ser: 0.7 mg/dL (ref 0.44–1.00)

## 2018-11-11 MED ORDER — GADOBUTROL 1 MMOL/ML IV SOLN
6.0000 mL | Freq: Once | INTRAVENOUS | Status: AC | PRN
  Administered 2018-11-11: 6 mL via INTRAVENOUS

## 2018-11-11 MED ORDER — ONDANSETRON HCL 8 MG PO TABS: 8.0000 mg | ORAL_TABLET | Freq: Two times a day (BID) | ORAL | 1 refills | Status: AC | PRN

## 2018-11-11 NOTE — Progress Notes (Signed)
DISCONTINUE ON PATHWAY REGIMEN - Melanoma and Other Skin Cancers  No Medical Intervention - Off Treatment.  REASON: Other Reason PRIOR TREATMENT: MEL50: Referral to Radiation Oncology  START ON PATHWAY REGIMEN - Melanoma and Other Skin Cancers     A cycle is 21 days:     Pembrolizumab   **Always confirm dose/schedule in your pharmacy ordering system**  Patient Characteristics: Melanoma, Postoperative without Neoadjuvant Therapy (Pathologic Staging), Any pT, pN+, BRAF V600 Wild Type / BRAF V600 Results Pending or Unknown, Stage IIIB/IIIC/IIID Disease Classification: Melanoma Disease Subtype: Cutaneous Therapeutic Status: Postoperative without Neoadjuvant Therapy (Pathologic Staging) BRAF V600 Mutation Status: BRAF V600 Wild Type (No Mutation) AJCC T Category: pTX AJCC N Category: pN3 AJCC M Category: cM0 AJCC 8 Stage Grouping: Unknown Intent of Therapy: Curative Intent, Discussed with Patient

## 2018-11-11 NOTE — Progress Notes (Signed)
Melanoma and Other Skin Cancers - No Medical Intervention - Off Treatment. ° °Patient Characteristics: °Cutaneous Squamous Cell Carcinoma, Postoperative without Neoadjuvant Therapy °Disease Classification: Cutaneous Squamous Cell Carcinoma °Therapeutic Status: Postoperative without Neoadjuvant Therapy °Click here if multiple primary tumors are present.: false ° °

## 2018-11-12 DIAGNOSIS — H353131 Nonexudative age-related macular degeneration, bilateral, early dry stage: Secondary | ICD-10-CM | POA: Diagnosis not present

## 2018-11-15 ENCOUNTER — Encounter: Payer: Self-pay | Admitting: Oncology

## 2018-11-15 ENCOUNTER — Inpatient Hospital Stay (HOSPITAL_BASED_OUTPATIENT_CLINIC_OR_DEPARTMENT_OTHER): Payer: PPO | Admitting: Oncology

## 2018-11-15 ENCOUNTER — Telehealth: Payer: Self-pay | Admitting: *Deleted

## 2018-11-15 ENCOUNTER — Other Ambulatory Visit: Payer: Self-pay

## 2018-11-15 ENCOUNTER — Other Ambulatory Visit: Payer: Self-pay | Admitting: Oncology

## 2018-11-15 ENCOUNTER — Encounter: Payer: Self-pay | Admitting: Emergency Medicine

## 2018-11-15 ENCOUNTER — Emergency Department
Admission: EM | Admit: 2018-11-15 | Discharge: 2018-11-16 | Disposition: A | Discharge status: Home / Self Care | Payer: PPO | Attending: Emergency Medicine | Admitting: Emergency Medicine

## 2018-11-15 ENCOUNTER — Emergency Department: Payer: PPO

## 2018-11-15 DIAGNOSIS — C439 Malignant melanoma of skin, unspecified: Secondary | ICD-10-CM

## 2018-11-15 DIAGNOSIS — Z79899 Other long term (current) drug therapy: Secondary | ICD-10-CM | POA: Insufficient documentation

## 2018-11-15 DIAGNOSIS — Z853 Personal history of malignant neoplasm of breast: Secondary | ICD-10-CM | POA: Insufficient documentation

## 2018-11-15 DIAGNOSIS — E039 Hypothyroidism, unspecified: Secondary | ICD-10-CM | POA: Diagnosis not present

## 2018-11-15 DIAGNOSIS — L589 Radiodermatitis, unspecified: Secondary | ICD-10-CM | POA: Diagnosis not present

## 2018-11-15 DIAGNOSIS — E237 Disorder of pituitary gland, unspecified: Secondary | ICD-10-CM

## 2018-11-15 DIAGNOSIS — R296 Repeated falls: Secondary | ICD-10-CM | POA: Diagnosis not present

## 2018-11-15 DIAGNOSIS — R531 Weakness: Secondary | ICD-10-CM | POA: Insufficient documentation

## 2018-11-15 DIAGNOSIS — R21 Rash and other nonspecific skin eruption: Secondary | ICD-10-CM | POA: Diagnosis not present

## 2018-11-15 DIAGNOSIS — I10 Essential (primary) hypertension: Secondary | ICD-10-CM | POA: Insufficient documentation

## 2018-11-15 LAB — COMPREHENSIVE METABOLIC PANEL
ALT: 12 U/L (ref 0–44)
AST: 19 U/L (ref 15–41)
Albumin: 3.9 g/dL (ref 3.5–5.0)
Alkaline Phosphatase: 84 U/L (ref 38–126)
Anion gap: 9 (ref 5–15)
BUN: 15 mg/dL (ref 8–23)
CO2: 24 mmol/L (ref 22–32)
Calcium: 9 mg/dL (ref 8.9–10.3)
Chloride: 100 mmol/L (ref 98–111)
Creatinine, Ser: 0.63 mg/dL (ref 0.44–1.00)
GFR calc Af Amer: >60 mL/min (ref >60)
GFR calc non Af Amer: >60 mL/min (ref >60)
Glucose, Bld: 113 mg/dL — ABNORMAL HIGH (ref 70–99)
Potassium: 3.9 mmol/L (ref 3.5–5.1)
Sodium: 133 mmol/L — ABNORMAL LOW (ref 135–145)
Total Bilirubin: 0.7 mg/dL (ref 0.3–1.2)
Total Protein: 7.1 g/dL (ref 6.5–8.1)

## 2018-11-15 LAB — CBC
HCT: 41.7 % (ref 36.0–46.0)
Hemoglobin: 13.6 g/dL (ref 12.0–15.0)
MCH: 26.7 pg (ref 26.0–34.0)
MCHC: 32.6 g/dL (ref 30.0–36.0)
MCV: 81.9 fL (ref 80.0–100.0)
Platelets: 252 10*3/uL (ref 150–400)
RBC: 5.09 MIL/uL (ref 3.87–5.11)
RDW: 15.1 % (ref 11.5–15.5)
WBC: 9.1 10*3/uL (ref 4.0–10.5)
nRBC: 0 % (ref 0.0–0.2)

## 2018-11-15 LAB — URINALYSIS, COMPLETE (UACMP) WITH MICROSCOPIC
Bacteria, UA: NONE SEEN
Bilirubin Urine: NEGATIVE
Glucose, UA: NEGATIVE mg/dL
Hgb urine dipstick: NEGATIVE
Ketones, ur: 5 mg/dL — AB
Nitrite: NEGATIVE
Protein, ur: NEGATIVE mg/dL
Specific Gravity, Urine: 1.016 (ref 1.005–1.030)
pH: 5 (ref 5.0–8.0)

## 2018-11-15 LAB — TROPONIN I: Troponin I: <0.03 ng/mL (ref <0.03)

## 2018-11-15 MED ORDER — VALACYCLOVIR HCL 1 G PO TABS: 1000.0000 mg | ORAL_TABLET | Freq: Two times a day (BID) | ORAL | 0 refills | Status: DC

## 2018-11-15 MED ORDER — TRAMADOL HCL 50 MG PO TABS: 50.0000 mg | ORAL_TABLET | Freq: Three times a day (TID) | ORAL | 0 refills | Status: DC | PRN

## 2018-11-15 NOTE — Progress Notes (Addendum)
HEMATOLOGY-ONCOLOGY TeleHEALTH VISIT PROGRESS NOTE  I connected with Katie Keith on 11/15/18 at  3:30 PM EDT by video enabled telemedicine visit and verified that I am speaking with the correct person using two identifiers. I discussed the limitations, risks, security and privacy concerns of performing an evaluation and management service by telemedicine and the availability of in-person appointments. I also discussed with the patient that there may be a patient responsible charge related to this service. The patient expressed understanding and agreed to proceed.   Other persons participating in the visit and their role in the encounter:  Katie Keith, CMA, check in patient   Daughter Katie Keith to set up virtual visit and provide history.   Patient's location: Home  Provider's location: work Risk analyst Complaint: Acute visits for multiple complaints.   INTERVAL HISTORY Katie Keith is a 73 y.o. female who has above history reviewed by me today presents for follow up visit for management of melanoma, acute visit for multiple complaints. Problems and complaints are listed below:  Patient status post adjuvant radiation for right axillary melanoma status post resection. She finished radiation around 10/26/2018. Has developed right axillary skin changes, which was noted on 11/10/2018 by Dr. Bary Castilla.  Patient was prescribed triple antibiotic ointment to the area. Patient's daughter is a Marine scientist who measured patient's vital sign.  See checking note. Patient reports feeling very tired and weak.  Has no energy.  Lack of appetite very poor oral intake. She reports a skin rash on posterior right shoulder, axillary, burning and painful sensation, no exacerbating factors.  Ibuprofen makes burning sensation/pain better..  Blood pressure was high at home 201/99.  She feels very anxious about starting immunotherapy tomorrow. Also report bilateral leg pain. Endorses stomach discomfort.  Patient has been using  ibuprofen which she feels may have caused stomach discomfort.  Review of Systems  Constitutional: Positive for appetite change and fatigue.  Respiratory: Negative for cough and shortness of breath.   Gastrointestinal: Negative for abdominal distention.  Genitourinary: Negative for frequency.     Past Medical History:  Diagnosis Date  . Anemia    b12 injections twice a month  . Breast cancer ([place]) 1992   LT LUMPECTOMY DCIS  . Cancer ([place]) 1992   DCIS left breast, treated with wide excision.  . Complication of anesthesia   . Diverticulosis 2006  . Enlarged lymph nodes in armpit 08/2018  . GERD (gastroesophageal reflux disease)   . Hypertension   . Hypothyroidism   . Melanoma ([place]) 08/2018  . Migraines   . Peripheral neuropathy    feet  . Pituitary mass ([place]) 08/2018   right  . PONV (postoperative nausea and vomiting)   . Thyroid disease    Past Surgical History:  Procedure Laterality Date  . AXILLARY LYMPH NODE BIOPSY Right 08/24/2018   Procedure: AXILLARY LYMPH NODE BIOPSY;  Surgeon: Robert Bellow, MD;  Location: [facility] ORS;  Service: General;  Laterality: Right;  . AXILLARY LYMPH NODE BIOPSY Bilateral 09/06/2018   Procedure: AXILLARY LYMPH NODE BIOPSY LEFT;  Surgeon: Robert Bellow, MD;  Location: [facility] ORS;  Service: General;  Laterality: Bilateral;  . AXILLARY LYMPH NODE DISSECTION Bilateral 09/06/2018   Procedure: AXILLARY LYMPH NODE DISSECTION RIGHT;  Surgeon: Robert Bellow, MD;  Location: [facility] ORS;  Service: General;  Laterality: Bilateral;  . BREAST BIOPSY Left 03/21/2016   2 areas done by byrnett fibrocystic changes  . BREAST LUMPECTOMY Left 1993   DCIS, no chemo no radation  . BREAST  SURGERY Left 1992   wide excision for DCIS  . CATARACT EXTRACTION W/PHACO Left 08/04/2017   Procedure: CATARACT EXTRACTION PHACO AND INTRAOCULAR LENS PLACEMENT (IOC);  Surgeon: Birder Robson, MD;  Location: [facility] ORS;  Service: Ophthalmology;  Laterality: Left;  [place] 00:40.7  AP% 14.6 CDE 5.97 Fluid Pack Lot # [id number]  . CATARACT EXTRACTION W/PHACO Right 08/18/2017   Procedure: CATARACT EXTRACTION PHACO AND INTRAOCULAR LENS PLACEMENT ([place]);  Surgeon: Birder Robson, MD;  Location: [facility] ORS;  Service: Ophthalmology;  Laterality: Right;  [place] 00:51.9 AP% 17.1 CDE 8.84 Fluid Pack Lot # [id number]  . COLONOSCOPY  2006   Dr Bary Castilla  . COLONOSCOPY WITH PROPOFOL N/A 04/18/2015   Procedure: COLONOSCOPY WITH PROPOFOL;  Surgeon: Robert Bellow, MD;  Location[facility] ENDOSCOPY;  Service: Endoscopy;  Laterality: N/A;  . HERNIA REPAIR    . thumb surgery     injury with infection    Family History  Problem Relation Age of Onset  . Breast cancer Mother        16'S  . Diabetes Mother   . Lung cancer Father   . Cancer Sister 25       breast  . Breast cancer Sister 41  . Breast cancer Paternal Grandmother        70'S  . Diverticulitis Son        part of colon removed  . Neuropathy Son   . Diabetes Sister   . Hypertension Sister   . Neuropathy Son   . Diverticulitis Son     Social History   Socioeconomic History  . Marital status: Widowed    Spouse name: Not on file  . Number of children: 3  . Years of education: Not on file  . Highest education level: Associate degree: occupational, Hotel manager, or vocational program  Occupational History  . Occupation: retired  Scientific laboratory technician  . Financial resource strain: Not hard at all  . Food insecurity:    Worry: Never true    Inability: Never true  . Transportation needs:    Medical: No    Non-medical: No  Tobacco Use  . Smoking status: Never Smoker  . Smokeless tobacco: Never Used  Substance and Sexual Activity  . Alcohol use: No  . Drug use: No  . Sexual activity: Not Currently  Lifestyle  . Physical activity:    Days per week: 0 days    Minutes per session: 0 min  . Stress: To some extent  Relationships  . Social connections:    Talks on phone: Patient refused    Gets together: Patient refused     Attends religious service: Patient refused    Active member of club or organization: Patient refused    Attends meetings of clubs or organizations: Patient refused    Relationship status: Patient refused  . Intimate partner violence:    Fear of current or ex partner: Patient refused    Emotionally abused: Patient refused    Physically abused: Patient refused    Forced sexual activity: Patient refused  Other Topics Concern  . Not on file  Social History Narrative   Husband passed in 11/2017.    Current Outpatient Medications on File Prior to Visit  Medication Sig Dispense Refill  . acetaminophen (TYLENOL) 325 MG tablet Take 325-975 mg by mouth every 6 (six) hours as needed for moderate pain or headache.    . alendronate (FOSAMAX) 70 MG tablet Take 1 tablet (70 mg total) by mouth every 7 (seven) days. Take  with a full glass of water on an empty stomach. 4 tablet 11  . Artificial Tear Ointment (DRY EYES OP) Apply 1 drop to eye as needed (dry eyes).     Marland Kitchen aspirin 81 MG tablet Take 81 mg by mouth daily. In the evening    . carvedilol (COREG) 6.25 MG tablet TAKE 1 TABLET BY MOUTH TWICE DAILY 60 tablet 11  . cyanocobalamin (,VITAMIN B-12,) 1000 MCG/ML injection Inject 1000 mcg IM every 2 weeks (Patient taking differently: Inject 1,000 mcg into the muscle every 14 (fourteen) days. Inject 1000 mcg IM every 2 weeks) 10 mL 3  . lansoprazole (PREVACID) 30 MG capsule Take 1 capsule (30 mg total) by mouth daily at 12 noon. 30 capsule 11  . levothyroxine (SYNTHROID, LEVOTHROID) 112 MCG tablet TAKE 1 TABLET BY MOUTH ONCE DAILY BEFORE BREAKFAST (Patient taking differently: Take 112 mcg by mouth daily before breakfast. ) 60 tablet 6  . Multiple Vitamins-Minerals (PRESERVISION AREDS PO) Take by mouth.    Marland Kitchen NEEDLE, DISP, 25 G 25G X 1" MISC Inject B12 twice a month 10 each 12  . ondansetron (ZOFRAN) 8 MG tablet Take 1 tablet (8 mg total) by mouth 2 (two) times daily as needed (Nausea or vomiting). 30 tablet 1   . OVER THE COUNTER MEDICATION Take 1 capsule by mouth 2 (two) times daily. Alphoric Acid    . sodium chloride (OCEAN) 0.65 % SOLN nasal spray Place 1 spray into both nostrils as needed for congestion.    . Syringe, Disposable, 3 ML MISC Use to inject B12 twice a month 25 each 12   No current facility-administered medications on file prior to visit.     Allergies  Allergen Reactions  . Penicillins Swelling and Other (See Comments)    Has patient had a PCN reaction causing immediate rash, facial/tongue/throat swelling, SOB or lightheadedness with hypotension: Yes Has patient had a PCN reaction causing severe rash involving mucus membranes or skin necrosis: No Has patient had a PCN reaction that required hospitalization: No Has patient had a PCN reaction occurring within the last 10 years: No If all of the above answers are "NO", then may proceed with Cephalosporin use.   . Sulfa Antibiotics Swelling and Rash    swelling       Observations/Objective: There were no vitals filed for this visit. There is no height or weight on file to calculate BMI.  Physical Exam  Constitutional: She is oriented to person, place, and time. No distress.  HENT:  Head: Normocephalic and atraumatic.  Pulmonary/Chest: Effort normal.  Neurological: She is alert and oriented to person, place, and time.  Skin:  Right posterior shoulder/chest wall erythematous rash with vesicles. Right axillary erythematous rash.  Psychiatric: Affect normal.         CBC    Component Value Date/Time   WBC 9.1 11/15/2018 2130   RBC 5.09 11/15/2018 2130   HGB 13.6 11/15/2018 2130   HGB 12.6 08/20/2018 1119   HCT 41.7 11/15/2018 2130   HCT 38.6 08/20/2018 1119   PLT 252 11/15/2018 2130   PLT 283 08/20/2018 1119   MCV 81.9 11/15/2018 2130   MCV 84 08/20/2018 1119   MCH 26.7 11/15/2018 2130   MCHC 32.6 11/15/2018 2130   RDW 15.1 11/15/2018 2130   RDW 14.9 08/20/2018 1119   LYMPHSABS 1.5 08/20/2018 1119   EOSABS  0.2 08/20/2018 1119   BASOSABS 0.0 08/20/2018 1119    CMP     Component Value Date/Time  NA 133 (L) 11/15/2018 2130   NA 139 08/20/2018 1119   K 3.9 11/15/2018 2130   CL 100 11/15/2018 2130   CO2 24 11/15/2018 2130   GLUCOSE 113 (H) 11/15/2018 2130   BUN 15 11/15/2018 2130   BUN 16 08/20/2018 1119   CREATININE 0.63 11/15/2018 2130   CALCIUM 9.0 11/15/2018 2130   PROT 7.1 11/15/2018 2130   PROT 6.9 08/20/2018 1119   ALBUMIN 3.9 11/15/2018 2130   ALBUMIN 3.9 08/20/2018 1119   AST 19 11/15/2018 2130   ALT 12 11/15/2018 2130   ALKPHOS 84 11/15/2018 2130   BILITOT 0.7 11/15/2018 2130   BILITOT 0.5 08/20/2018 1119   GFRNONAA >60 11/15/2018 2130   GFRAA >60 11/15/2018 2130     Assessment and Plan: 1. Melanoma of skin ([place])   2. Pituitary lesion ([facility])   3. Radiation dermatitis   4. Skin rash   5. Uncontrolled hypertension     #Melanoma of the skin, status post surgery with lymph node dissection.  Status post radiation. Patient supposed to start adjuvant immunotherapy this week.  Given patient's multiple complaints and poor performance status, will cancel immunotherapy this week.  Reschedule when patient's condition improves.  #Radiation dermatitis, I discussed via secure chat with Dr. Baruch Gouty who reviewed patient's rash pictures and I recommend patient to stop the triple antibiotics with concerns of possible allergic reaction.  Dr. Baruch Gouty informed me that he has contacted patient's and advised patient to use Silvadene cream. If rash gets worse, Dr.Chrystal will see patient later this week.    #Rash on the shoulder, with burning sensation. Evaluation is limited due to virtual visit.  questionable shingles.  I will cover with Valtrex 1000 mg twice daily for 5 days. #Skin burning and pain, recommend trial of tramadol 50 mg every 8 hours as needed.  Potential side effects were discussed with patient and daughter. #Uncontrolled hypertension I recommend patient to go to emergency  room for evaluation. Patient initially prefers to try high blood pressure medication and pain medication at home.  Later on daughter communicated with me that the patient has multiple falls so patient went to emergency room for further evaluation.  # Pituitary lesion, 80mm pituitary mass on outside MRI brain 08/31/2018. Repeat MRI with and without contrast 11/11/2018 showed normal appearance of the pituitary gland. Images were reviewed and discussed with patient.  Follow Up Instructions: To be determined.  Daughter will call the clinic to reschedule when patient's condition improves.   I discussed the assessment and treatment plan with the patient. The patient was provided an opportunity to ask questions and all were answered. The patient agreed with the plan and demonstrated an understanding of the instructions.  The patient was advised to call back or seek an in-person evaluation if the symptoms worsen or if the condition fails to improve as anticipated.   I provided 25 minutes of face-to-face video visit time during this encounter, and > 50% was spent counseling as documented under my assessment & plan.  Earlie Server, MD 11/15/2018 10:42 PM

## 2018-11-15 NOTE — ED Triage Notes (Addendum)
Pt to triage via w/c with no distress noted, mask in place; pt reports has fallen several times today due to generalized weakness; denies any injuries; pt has painful rash to right side shoulder radiating under axillae x 3wks; st recent radiation (melanoma)

## 2018-11-15 NOTE — Telephone Encounter (Signed)
Daughter [name] called reporting patient is not feeling well to the point that she does not know that patient will be able to keep her appointment tomorrow and would like a return call from Dr Tasia Catchings. [phone]

## 2018-11-15 NOTE — Progress Notes (Signed)
Called patient for Telehealth visit via [place].  Patient c/o dry mouth, thirsty, increase in urination.  Thigh and leg pain, lower abdominal pain.  Patients daughter gives vitals as: 97.9 temp, 76 heart rate, 201/99 BP. Patient is very anxious, no energy, no appetite, not sleeping.  Patient has rash from breast to back, pictures can be found in chart.  Patient states that she does not feel she can make it to her appointment in clinic tomorrow.

## 2018-11-15 NOTE — ED Provider Notes (Signed)
-----------------------------------------   10:57 PM on 11/15/2018 -----------------------------------------   Assuming care from Dr. Kerman Passey.  In short, Katie Keith is a 73 y.o. female with a chief complaint of weakness and falls.  Refer to the original H&P for additional details.  The current plan of care is to follow up MRI and reassess.  Anticipate discharge if MRI is negative for CVA.   ----------------------------------------- 12:06 AM on 11/16/2018 -----------------------------------------  MRI reassuring with no sign of acute CVA.  Patient is asymptomatic at this time and ready to go home.  She says that she did not take her evening blood pressure medicine and she has no symptoms at this time so I think it is appropriate to discharge her in spite of her hypertension for her to take her regular medications at home.  I gave my usual and customary return precautions.   Hinda Kehr, MD 11/16/18 [id number]

## 2018-11-15 NOTE — ED Provider Notes (Signed)
[facility] Emergency Department Provider Note  Time seen: 9:18 PM  I have reviewed the triage vital signs and the nursing notes.   HISTORY  Chief Complaint Fall   HPI Katie Keith is a 73 y.o. female with a past medical history of anemia, gastric reflux, hypertension, melanoma currently receiving radiation treatments presents to the emergency department for generalized weakness and several falls.  According to the patient since around 5 PM tonight she has felt very weak and has had 3 falls this evening due to generalized weakness.  Patient states she slumps down she has not fallen and hit in her head.  Denies any LOC.  Denies any headache.  Patient denies any localized weakness or unilateral weakness but states a sensation of generalized weakness.  Patient states she had an MRI of the brain performed on Friday that was normal  per patient.  Patient has developed a shingles rash over the past 2 weeks, has completed a course of prednisone and her doctor prescribed her valacyclovir which she has not yet started but will start tomorrow.  The area of rash is in the right upper back/upper chest.  Denies any fever cough congestion or shortness of breath.  Denies any urinary symptoms nausea vomiting or diarrhea.  No chest pain or abdominal pain.  Past Medical History:  Diagnosis Date  . Anemia    b12 injections twice a month  . Breast cancer ([facility]) 1992   LT LUMPECTOMY DCIS  . Cancer ([place]) 1992   DCIS left breast, treated with wide excision.  . Complication of anesthesia   . Diverticulosis 2006  . Enlarged lymph nodes in armpit 08/2018  . GERD (gastroesophageal reflux disease)   . Hypertension   . Hypothyroidism   . Melanoma ([place]) 08/2018  . Migraines   . Peripheral neuropathy    feet  . Pituitary mass ([place]) 08/2018   right  . PONV (postoperative nausea and vomiting)   . Thyroid disease     Patient Active Problem List   Diagnosis Date Noted  . Melanoma of  skin ([place]) 11/11/2018  . Chest wall pain 11/03/2018  . Goals of care, counseling/discussion 09/02/2018  . Axillary lymphadenopathy 09/02/2018  . Malignant melanoma ([place]) 09/02/2018  . Lymphadenopathy, axillary 08/20/2018  . Compression fracture of thoracic vertebra ([place]) 03/10/2017  . Hypertension 07/04/2015  . Hypothyroidism 07/04/2015  . Eustachian tube dysfunction 03/16/2015  . Encounter for screening colonoscopy 02/22/2015  . History of breast cancer 02/22/2015  . Vitamin B12 deficiency 02/16/2015  . Generalized abdominal pain 10/04/2014  . Left-sided thoracic back pain 10/04/2014    Past Surgical History:  Procedure Laterality Date  . AXILLARY LYMPH NODE BIOPSY Right 08/24/2018   Procedure: AXILLARY LYMPH NODE BIOPSY;  Surgeon: Robert Bellow, MD;  Location: [facility] ORS;  Service: General;  Laterality: Right;  . AXILLARY LYMPH NODE BIOPSY Bilateral 09/06/2018   Procedure: AXILLARY LYMPH NODE BIOPSY LEFT;  Surgeon: Robert Bellow, MD;  Location: [facility] ORS;  Service: General;  Laterality: Bilateral;  . AXILLARY LYMPH NODE DISSECTION Bilateral 09/06/2018   Procedure: AXILLARY LYMPH NODE DISSECTION RIGHT;  Surgeon: Robert Bellow, MD;  Location: [facility] ORS;  Service: General;  Laterality: Bilateral;  . BREAST BIOPSY Left 03/21/2016   2 areas done by byrnett fibrocystic changes  . BREAST LUMPECTOMY Left 1993   DCIS, no chemo no radation  . BREAST SURGERY Left 1992   wide excision for DCIS  . CATARACT EXTRACTION W/PHACO Left 08/04/2017   Procedure:  CATARACT EXTRACTION PHACO AND INTRAOCULAR LENS PLACEMENT (IOC);  Surgeon: Birder Robson, MD;  Location: [facility] ORS;  Service: Ophthalmology;  Laterality: Left;  [place] 00:40.7 AP% 14.6 CDE 5.97 Fluid Pack Lot # [id number]  . CATARACT EXTRACTION W/PHACO Right 08/18/2017   Procedure: CATARACT EXTRACTION PHACO AND INTRAOCULAR LENS PLACEMENT ([place]);  Surgeon: Birder Robson, MD;  Location: [facility] ORS;  Service: Ophthalmology;  Laterality:  Right;  [place] 00:51.9 AP% 17.1 CDE 8.84 Fluid Pack Lot # [id number]  . COLONOSCOPY  2006   Dr Bary Castilla  . COLONOSCOPY WITH PROPOFOL N/A 04/18/2015   Procedure: COLONOSCOPY WITH PROPOFOL;  Surgeon: Robert Bellow, MD;  Location: [facility] ENDOSCOPY;  Service: Endoscopy;  Laterality: N/A;  . HERNIA REPAIR    . thumb surgery     injury with infection    Prior to Admission medications   Medication Sig Start Date End Date Taking? Authorizing Provider  acetaminophen (TYLENOL) 325 MG tablet Take 325-975 mg by mouth every 6 (six) hours as needed for moderate pain or headache.    [provider]  alendronate (FOSAMAX) 70 MG tablet Take 1 tablet (70 mg total) by mouth every 7 (seven) days. Take with a full glass of water on an empty stomach. 06/11/18   Jerrol Banana., MD  Artificial Tear Ointment (DRY EYES OP) Apply 1 drop to eye as needed (dry eyes).     [provider]  aspirin 81 MG tablet Take 81 mg by mouth daily. In the evening    [provider]  carvedilol (COREG) 6.25 MG tablet TAKE 1 TABLET BY MOUTH TWICE DAILY 07/23/18   Jerrol Banana., MD  cyanocobalamin (,VITAMIN B-12,) 1000 MCG/ML injection Inject 1000 mcg IM every 2 weeks Patient taking differently: Inject 1,000 mcg into the muscle every 14 (fourteen) days. Inject 1000 mcg IM every 2 weeks 11/04/17   Jerrol Banana., MD  lansoprazole (PREVACID) 30 MG capsule Take 1 capsule (30 mg total) by mouth daily at 12 noon. 11/02/18   Noreene Filbert, MD  levothyroxine (SYNTHROID, LEVOTHROID) 112 MCG tablet TAKE 1 TABLET BY MOUTH ONCE DAILY BEFORE BREAKFAST Patient taking differently: Take 112 mcg by mouth daily before breakfast.  01/25/18   Jerrol Banana., MD  Multiple Vitamins-Minerals (PRESERVISION AREDS PO) Take by mouth.    [provider]  NEEDLE, DISP, 25 G 25G X 1" MISC Inject B12 twice a month 10/19/15   Jerrol Banana., MD  ondansetron (ZOFRAN) 8 MG tablet Take 1 tablet (8 mg  total) by mouth 2 (two) times daily as needed (Nausea or vomiting). 11/11/18   Earlie Server, MD  OVER THE COUNTER MEDICATION Take 1 capsule by mouth 2 (two) times daily. Alphoric Acid    [provider]  sodium chloride (OCEAN) 0.65 % SOLN nasal spray Place 1 spray into both nostrils as needed for congestion.    [provider]  Syringe, Disposable, 3 ML MISC Use to inject B12 twice a month 10/19/15   Jerrol Banana., MD  traMADol (ULTRAM) 50 MG tablet Take 1 tablet (50 mg total) by mouth every 8 (eight) hours as needed. 11/15/18   Earlie Server, MD  valACYclovir (VALTREX) 1000 MG tablet Take 1 tablet (1,000 mg total) by mouth 2 (two) times daily. 11/15/18   Earlie Server, MD    Allergies  Allergen Reactions  . Penicillins Swelling and Other (See Comments)    Has patient had a PCN reaction causing immediate rash, facial/tongue/throat swelling, SOB  or lightheadedness with hypotension: Yes Has patient had a PCN reaction causing severe rash involving mucus membranes or skin necrosis: No Has patient had a PCN reaction that required hospitalization: No Has patient had a PCN reaction occurring within the last 10 years: No If all of the above answers are "NO", then may proceed with Cephalosporin use.   . Sulfa Antibiotics Swelling and Rash    swelling    Family History  Problem Relation Age of Onset  . Breast cancer Mother        72'S  . Diabetes Mother   . Lung cancer Father   . Cancer Sister 49       breast  . Breast cancer Sister 95  . Breast cancer Paternal Grandmother        70'S  . Diverticulitis Son        part of colon removed  . Neuropathy Son   . Diabetes Sister   . Hypertension Sister   . Neuropathy Son   . Diverticulitis Son     Social History Social History   Tobacco Use  . Smoking status: Never Smoker  . Smokeless tobacco: Never Used  Substance Use Topics  . Alcohol use: No  . Drug use: No    Review of Systems Constitutional: Negative for fever.   Positive for generalized weakness. Cardiovascular: Negative for chest pain. Respiratory: Negative for shortness of breath. Gastrointestinal: Negative for abdominal pain, vomiting Genitourinary: Negative for urinary compaints Musculoskeletal: Negative for musculoskeletal complaints Skin: Negative for skin complaints  Neurological: Negative for headache All other ROS negative  ____________________________________________   PHYSICAL EXAM:  VITAL SIGNS: ED Triage Vitals [11/15/18 2015]  Enc Vitals Group     BP (!) 142/91     Pulse Rate 92     Resp 18     Temp 98 F (36.7 C)     Temp Source Oral     SpO2 97 %     Weight 140 lb 6.9 oz (63.7 kg)     Height 5\' 2"  (1.575 m)     Head Circumference      Peak Flow      Pain Score 6     Pain Loc      Pain Edu?      Excl. in [place]?    Constitutional: Alert and oriented. Well appearing and in no distress. Eyes: Normal exam ENT      Head: Normocephalic and atraumatic.      Mouth/Throat: Mucous membranes are moist. Cardiovascular: Normal rate, regular rhythm. Respiratory: Normal respiratory effort without tachypnea nor retractions. Breath sounds are clear Gastrointestinal: Soft and nontender. No distention. Musculoskeletal: Nontender with normal range of motion in all extremities. Neurologic:  Normal speech and language. No gross focal neurologic deficits.  5/5 motor in all extremities.  Equal grip strength bilaterally.  No pronator drift.  No lower extremity drift.  Cranial nerves intact. Skin:  Skin is warm, dry and intact.  Psychiatric: Mood and affect are normal.   ____________________________________________    EKG  EKG viewed and interpreted by myself shows a normal sinus rhythm at 72 bpm with a narrow QRS, normal axis, normal intervals, no concerning ST changes.  ____________________________________________    [id number]  MRI pending  ____________________________________________   INITIAL IMPRESSION / ASSESSMENT AND  PLAN / ED COURSE  Pertinent labs & imaging results that were available during my care of the patient were reviewed by me and considered in my medical decision making (see chart for details).  Patient presents emergency department for generalized weakness and multiple falls.  Differential would include metabolic or electrolyte abnormality, renal insufficiency, infectious etiology, shingles related weakness.  Less likely CVA given intact neurological exam.  Less likely metastatic disease given normal MRI Friday per patient.  We will check labs, urinalysis and continue to closely monitor.  Overall patient's work-up is been largely nonrevealing, reassuring lab work reassuring urinalysis.  Patient was able to ambulate with no assistance in the room.  Patient still states she feels weak but feels it is more in the left foot and ankle more than anywhere else.  Patient does state a history of neuropathy.  I believe the patient's weakness could be related to neuropathy or possibly due to her recent shingles diagnosis.  However given her falls tonight and now localization of weakness to the left foot/ankle I believe an MRI is warranted to rule out CVA.  Patient states she just had an MRI done on Friday which was normal however unfortunately this is fairly useless in ruling out an acute CVA, patient agreeable to repeat MRI.  Patient would like to be discharged home if her MRI is normal which I believe is reasonable and appropriate.  If her MRI shows a CVA obviously the patient will require admission to the hospital.  Patient care signed out to Dr. Karma Greaser MRI pending.  Katie Keith was evaluated in Emergency Department on 11/15/2018 for the symptoms described in the history of present illness. She was evaluated in the context of the global COVID-19 pandemic, which necessitated consideration that the patient might be at risk for infection with the SARS-CoV-2 virus that causes COVID-19. Institutional protocols and  algorithms that pertain to the evaluation of patients at risk for COVID-19 are in a state of rapid change based on information released by regulatory bodies including the CDC and federal and state organizations. These policies and algorithms were followed during the patient's care in the ED.  ____________________________________________   FINAL CLINICAL IMPRESSION(S) / ED DIAGNOSES  Weakness Cheral Marker, MD 11/15/18 2248

## 2018-11-15 NOTE — Telephone Encounter (Signed)
Patient was seen via [facility] this afternoon.

## 2018-11-16 ENCOUNTER — Other Ambulatory Visit: Payer: Self-pay | Admitting: Oncology

## 2018-11-16 ENCOUNTER — Inpatient Hospital Stay: Payer: PPO

## 2018-11-16 ENCOUNTER — Telehealth: Payer: Self-pay | Admitting: *Deleted

## 2018-11-16 ENCOUNTER — Inpatient Hospital Stay: Payer: PPO | Admitting: Oncology

## 2018-11-16 NOTE — Discharge Instructions (Signed)
Your workup in the Emergency Department today was reassuring.  We did not find any specific abnormalities.  We recommend you drink plenty of fluids, take your regular medications and/or any new ones prescribed today, and follow up with the doctor(s) listed in these documents as recommended.  Return to the Emergency Department if you develop new or worsening symptoms that concern you.  

## 2018-11-16 NOTE — Telephone Encounter (Signed)
Patient called reporting that a prescription for Silvadene Cream was to be sent to Pharmacy and they do not have it. Please advise

## 2018-11-16 NOTE — Telephone Encounter (Signed)
Dr.Chrystal, she has sulfa allergy.

## 2018-11-17 ENCOUNTER — Encounter: Payer: Self-pay | Admitting: Radiation Oncology

## 2018-11-19 DIAGNOSIS — M4804 Spinal stenosis, thoracic region: Secondary | ICD-10-CM | POA: Diagnosis not present

## 2018-11-19 DIAGNOSIS — C439 Malignant melanoma of skin, unspecified: Secondary | ICD-10-CM | POA: Diagnosis not present

## 2018-11-19 DIAGNOSIS — R29898 Other symptoms and signs involving the musculoskeletal system: Secondary | ICD-10-CM | POA: Diagnosis not present

## 2018-11-19 DIAGNOSIS — C799 Secondary malignant neoplasm of unspecified site: Secondary | ICD-10-CM | POA: Diagnosis not present

## 2018-11-19 DIAGNOSIS — R531 Weakness: Secondary | ICD-10-CM | POA: Diagnosis not present

## 2018-11-19 DIAGNOSIS — K5732 Diverticulitis of large intestine without perforation or abscess without bleeding: Secondary | ICD-10-CM | POA: Diagnosis not present

## 2018-11-19 DIAGNOSIS — M818 Other osteoporosis without current pathological fracture: Secondary | ICD-10-CM | POA: Diagnosis not present

## 2018-11-19 DIAGNOSIS — C801 Malignant (primary) neoplasm, unspecified: Secondary | ICD-10-CM | POA: Diagnosis not present

## 2018-11-19 DIAGNOSIS — M79604 Pain in right leg: Secondary | ICD-10-CM | POA: Diagnosis not present

## 2018-11-19 DIAGNOSIS — R918 Other nonspecific abnormal finding of lung field: Secondary | ICD-10-CM | POA: Diagnosis not present

## 2018-11-19 DIAGNOSIS — M79605 Pain in left leg: Secondary | ICD-10-CM | POA: Diagnosis not present

## 2018-11-19 DIAGNOSIS — R110 Nausea: Secondary | ICD-10-CM | POA: Diagnosis not present

## 2018-11-19 DIAGNOSIS — C7951 Secondary malignant neoplasm of bone: Secondary | ICD-10-CM | POA: Diagnosis not present

## 2018-11-19 DIAGNOSIS — Z7982 Long term (current) use of aspirin: Secondary | ICD-10-CM | POA: Diagnosis not present

## 2018-11-19 DIAGNOSIS — M79651 Pain in right thigh: Secondary | ICD-10-CM | POA: Diagnosis not present

## 2018-11-19 DIAGNOSIS — R590 Localized enlarged lymph nodes: Secondary | ICD-10-CM | POA: Diagnosis not present

## 2018-11-19 DIAGNOSIS — C7931 Secondary malignant neoplasm of brain: Secondary | ICD-10-CM | POA: Diagnosis not present

## 2018-11-19 DIAGNOSIS — M438X4 Other specified deforming dorsopathies, thoracic region: Secondary | ICD-10-CM | POA: Diagnosis not present

## 2018-11-19 DIAGNOSIS — M79652 Pain in left thigh: Secondary | ICD-10-CM | POA: Diagnosis not present

## 2018-11-19 DIAGNOSIS — M438X6 Other specified deforming dorsopathies, lumbar region: Secondary | ICD-10-CM | POA: Diagnosis not present

## 2018-11-19 DIAGNOSIS — C787 Secondary malignant neoplasm of liver and intrahepatic bile duct: Secondary | ICD-10-CM | POA: Diagnosis not present

## 2018-11-19 DIAGNOSIS — R1030 Lower abdominal pain, unspecified: Secondary | ICD-10-CM | POA: Diagnosis not present

## 2018-11-19 DIAGNOSIS — M549 Dorsalgia, unspecified: Secondary | ICD-10-CM | POA: Diagnosis not present

## 2018-11-19 DIAGNOSIS — Z853 Personal history of malignant neoplasm of breast: Secondary | ICD-10-CM | POA: Diagnosis not present

## 2018-11-19 DIAGNOSIS — M48061 Spinal stenosis, lumbar region without neurogenic claudication: Secondary | ICD-10-CM | POA: Diagnosis not present

## 2018-11-19 DIAGNOSIS — R202 Paresthesia of skin: Secondary | ICD-10-CM | POA: Diagnosis not present

## 2018-11-19 DIAGNOSIS — M546 Pain in thoracic spine: Secondary | ICD-10-CM | POA: Diagnosis not present

## 2018-11-19 MED ORDER — KETOROLAC TROMETHAMINE 15 MG/ML IJ SOLN
15.00 | INTRAMUSCULAR | Status: DC
Start: 2018-11-19 — End: 2018-11-19

## 2018-11-20 DIAGNOSIS — C7951 Secondary malignant neoplasm of bone: Secondary | ICD-10-CM | POA: Diagnosis not present

## 2018-11-20 DIAGNOSIS — C799 Secondary malignant neoplasm of unspecified site: Secondary | ICD-10-CM | POA: Diagnosis not present

## 2018-11-21 MED ORDER — FP ANTI-DIARRHEAL 1 MG/5ML PO LIQD
500.00 | ORAL | Status: DC
Start: 2018-11-20 — End: 2018-11-21

## 2018-11-21 MED ORDER — METHYLPHENIDATE HCL POWD
50.00 | Status: DC
Start: ? — End: 2018-11-21

## 2018-11-21 MED ORDER — VICON FORTE PO CAPS
17.00 | ORAL_CAPSULE | ORAL | Status: DC
Start: 2018-11-21 — End: 2018-11-21

## 2018-11-21 MED ORDER — CIPROFLOXACIN IN D5W 400 MG/200ML IV SOLN
400.00 | INTRAVENOUS | Status: DC
Start: 2018-11-20 — End: 2018-11-21

## 2018-11-22 ENCOUNTER — Other Ambulatory Visit: Payer: Self-pay | Admitting: General Surgery

## 2018-11-22 ENCOUNTER — Encounter: Payer: Self-pay | Admitting: Oncology

## 2018-11-22 MED ORDER — OXYCODONE HCL 5 MG PO TABS: 2.5000 mg | ORAL_TABLET | ORAL | 0 refills | Status: AC | PRN

## 2018-11-22 MED ORDER — TRAMADOL HCL 50 MG PO TABS: 50.0000 mg | ORAL_TABLET | ORAL | 0 refills | Status: DC | PRN

## 2018-11-22 NOTE — Progress Notes (Signed)
The patient has been seen at her home on the evening of June 11 and was noted with a significantly abnormal lower extremity neurological exam.  She was evaluated by Dr. Manuella Ghazi in  neurology the following day which confirmed those findings, and she was sent to [facility] with the idea she might require a lumbar puncture.  Subsequent imaging studies at [facility] showed widespread metastatic disease in the spine including T6 and T12 as well as multiple hepatic and lung lesions thought secondary to metastatic disease.  The patient is been in significant pain and received good relief with oxycodone while she was in the emergency department at [facility] and has had reasonably good relief with tramadol at home.  The patient's case is being presented at the [facility] neurosurgery tumor conference tomorrow, and anticipate a call back from Dr. Lacinda Axon and neurosurgery with their recommendations.  With the widespread metastatic disease this will be a difficult decision if major surgery is proposed, with the potential for progressive motor and sensory loss with incontinence as well.  A prescription for additional pain medications is being provided to the patient pending further work-up.  The patient may be a candidate for early involvement of Hospice with her rapidly progressive disease.

## 2018-11-23 ENCOUNTER — Telehealth: Payer: Self-pay | Admitting: Oncology

## 2018-11-23 ENCOUNTER — Other Ambulatory Visit: Payer: Self-pay

## 2018-11-23 ENCOUNTER — Telehealth: Payer: Self-pay | Admitting: *Deleted

## 2018-11-23 ENCOUNTER — Other Ambulatory Visit: Payer: Self-pay | Admitting: Oncology

## 2018-11-23 DIAGNOSIS — C439 Malignant melanoma of skin, unspecified: Secondary | ICD-10-CM

## 2018-11-23 MED ORDER — FENTANYL 12 MCG/HR TD PT72: 1.0000 | MEDICATED_PATCH | TRANSDERMAL | 0 refills | Status: DC

## 2018-11-23 NOTE — Telephone Encounter (Signed)
See physician note

## 2018-11-23 NOTE — Telephone Encounter (Signed)
Daughter called requesting a referral to Hospice ASAP stating they need their help. She wants to be called back once the referral has been made

## 2018-11-23 NOTE — Telephone Encounter (Signed)
Called patient's daughter.  Daughter reports that her mother is having a lot of pain of her back. Takes oxycodone 5 mg every 4 hours as needed.  Still has a lot of pain.  Very weak, lying in bed for most of the time. She has an appointment with [facility] neurosurgery for discussion of possible surgery. We will set up virtual visit with patient and daughter tomorrow to discuss about most recent MRI results, management plan. Per daughter, patient has also expresses interest in hospice care.  Refer to palliative care for further discussion. For pain, continue oxycodone 5 mg every 4 hours as needed.  Add low-dose fentanyl patch 12.5 MCG every 72 hours.  If she is able to tolerate, potentially can go up to 25 MCG every 72 hours, will continue titrate dose with her pain.

## 2018-11-24 ENCOUNTER — Inpatient Hospital Stay: Payer: PPO | Attending: Oncology | Admitting: Oncology

## 2018-11-24 ENCOUNTER — Encounter: Payer: Self-pay | Admitting: Oncology

## 2018-11-24 ENCOUNTER — Other Ambulatory Visit: Payer: Self-pay

## 2018-11-24 ENCOUNTER — Inpatient Hospital Stay (HOSPITAL_BASED_OUTPATIENT_CLINIC_OR_DEPARTMENT_OTHER): Payer: PPO | Admitting: Hospice and Palliative Medicine

## 2018-11-24 DIAGNOSIS — G893 Neoplasm related pain (acute) (chronic): Secondary | ICD-10-CM

## 2018-11-24 DIAGNOSIS — C7951 Secondary malignant neoplasm of bone: Secondary | ICD-10-CM

## 2018-11-24 DIAGNOSIS — Z515 Encounter for palliative care: Secondary | ICD-10-CM | POA: Diagnosis not present

## 2018-11-24 DIAGNOSIS — C799 Secondary malignant neoplasm of unspecified site: Secondary | ICD-10-CM

## 2018-11-24 DIAGNOSIS — R531 Weakness: Secondary | ICD-10-CM

## 2018-11-24 DIAGNOSIS — Z7189 Other specified counseling: Secondary | ICD-10-CM

## 2018-11-24 DIAGNOSIS — C439 Malignant melanoma of skin, unspecified: Secondary | ICD-10-CM | POA: Diagnosis not present

## 2018-11-24 MED ORDER — DEXAMETHASONE 4 MG PO TABS: 4.0000 mg | ORAL_TABLET | Freq: Three times a day (TID) | ORAL | 0 refills | Status: AC

## 2018-11-24 MED ORDER — DOCUSATE SODIUM 100 MG PO CAPS: 100.0000 mg | ORAL_CAPSULE | Freq: Two times a day (BID) | ORAL | 2 refills | Status: AC

## 2018-11-24 NOTE — Progress Notes (Signed)
Virtual Visit via Telephone Note  I connected with Katie Keith on 11/24/18 at 10:30 AM EDT by telephone and verified that I am speaking with the correct person using two identifiers.   I discussed the limitations, risks, security and privacy concerns of performing an evaluation and management service by telephone and the availability of in person appointments. I also discussed with the patient that there may be a patient responsible charge related to this service. The patient expressed understanding and agreed to proceed.   History of Present Illness: Katie Keith is a 73 year old woman with multiple medical problems including metastatic melanoma status post resection and XRT to right axilla.  Patient recently developed severe back pain and weakness which led to evaluation in the ER on 11/15/2018.  Patient underwent MRI of the brain but was not found to have any acute abnormalities.  She was subsequently referred to neurology and imaging at [facility] showed widespread metastatic disease in the spine including T6 and T12 as well as multiple hepatic and lung lesions likely reflecting rapidly progressing metastatic disease.  Patient has had severe pain and subsequent debility.  She is referred to palliative care to help address goals.   Observations/Objective:  Neoplasm-related pain in setting of multiple compression fractures -  Patient was started on transdermal fentanyl on 12.5 mcg every 72 hours, with first patch applied last night.  Patient recognizes that it may take 18 to 24 hours to reach cmax.  Patient does think that the pain is slightly better today.  She says pain yesterday was 15 out of 10 and today is 5 out of 10.  Patient does continue to require oxycodone every 4 hours.  Discussed the role of an LAO versus SAO for management of breakthrough pain.  We will order a hospital bed as I feel that frequent repositioning may help alleviate her pain. Patient suffers from severe chronic back pain which  is caused by metastatic melanoma with multiple compression fractures.  A hospital bed will alleviate pain by allowing back and knees to be positioned in ways not feasible with a normal bed.  Pain episodes frequently require immediate changes in body position which cannot be achieved with a normal bed. The patient becomes short of breath when lying flat due to the above stated reason.   We will plan to titrate opioids as needed to provide comfort.  Weakness -patient's performance status has declined significantly over the past 2 weeks.  She reports no longer being able to stand without assistance.  She is ambulating with use of a walker and family assistance.  She is requiring significant help from family with all ADLs.  Patient reports being mostly in the bed.  Patient has a long-term care insurance policy, which she intends to activate to provide in-home care.  Family have arranged for caregivers to start tomorrow.  Family are staying with her 24/7.  Goals of care - patient had hoped to start immunotherapy and/or radiation but recognizes that her weakness may limit those options.  They are awaiting a telephone visit by the [facility] neurosurgical team on Monday to discuss options.  Patient asked me about hospice care, which we discussed in detail.  Per daughter's request, I will go ahead and send an order for hospice.  However, family recognize that hospice admission may be delayed if patient/family opt for surgical interventions or additional treatment. Family and patient will ultimately decide if they want to go forward with hospice after speaking with the neurosurgical team on Monday.  Advance care planning - patient's daughter, Katie Keith is the healthcare power of attorney.  Patient says she also has a living will.  Patient does not want to be resuscitated nor have her life prolonged artificially on machines.  She requests to be a DO NOT RESUSCITATE.  Will sign and mail a DNR order to her home.   Assessment  and Plan: -Continue transdermal fentanyl 12.5 mcg every 72 hours with consideration of increasing to 25 mcg if needed. -Continue oxycodone 5 mg every 3 to 4 hours as needed for breakthrough pain. -Prophylactic bowel regimen -Hospice referral -DME: Hospital bed -DNR order mailed to home  Case and plan discussed with Dr. Tasia Catchings.  Follow Up Instructions: Future clinic/tele visits as needed   I discussed the assessment and treatment plan with the patient. The patient was provided an opportunity to ask questions and all were answered. The patient agreed with the plan and demonstrated an understanding of the instructions.   The patient was advised to call back or seek an in-person evaluation if the symptoms worsen or if the condition fails to improve as anticipated.  I provided 45 minutes of non-face-to-face time during this encounter.   Katie Hong, NP

## 2018-11-24 NOTE — Progress Notes (Signed)
Patient stated that she had stopped taking her Fosamax and would like to speak to the physician about it.

## 2018-11-25 ENCOUNTER — Telehealth: Payer: Self-pay | Admitting: *Deleted

## 2018-11-25 ENCOUNTER — Encounter: Payer: Self-pay | Admitting: Oncology

## 2018-11-25 DIAGNOSIS — C799 Secondary malignant neoplasm of unspecified site: Secondary | ICD-10-CM | POA: Insufficient documentation

## 2018-11-25 DIAGNOSIS — C439 Malignant melanoma of skin, unspecified: Secondary | ICD-10-CM | POA: Insufficient documentation

## 2018-11-25 DIAGNOSIS — G893 Neoplasm related pain (acute) (chronic): Secondary | ICD-10-CM

## 2018-11-25 DIAGNOSIS — C7951 Secondary malignant neoplasm of bone: Secondary | ICD-10-CM | POA: Insufficient documentation

## 2018-11-25 HISTORY — DX: Neoplasm related pain (acute) (chronic): G89.3

## 2018-11-25 MED ORDER — FENTANYL 12 MCG/HR TD PT72: 1.0000 | MEDICATED_PATCH | TRANSDERMAL | 0 refills | Status: DC

## 2018-11-25 NOTE — Progress Notes (Signed)
HEMATOLOGY-ONCOLOGY TeleHEALTH VISIT PROGRESS NOTE  I connected with [name] on 11/25/18 at  1:45 PM EDT by video enabled telemedicine visit and verified that I am speaking with the correct person using two identifiers. I discussed the limitations, risks, security and privacy concerns of performing an evaluation and management service by telemedicine and the availability of in-person appointments. I also discussed with the patient that there may be a patient responsible charge related to this service. The patient expressed understanding and agreed to proceed.   Other persons participating in the visit and their role in the encounter:  Janeann Merl, RN, check in patient.   Patient's location: Home  Provider's location:  office Chief Complaint: metastatic melanoma, discussion of goal of care.    INTERVAL HISTORY CHAQUETTA Keith is a 73 y.o. female who has above history reviewed by me today presents for follow up visit for metastatic melanoma, discussion of goals of care.   Problems and complaints are listed below:  She feels very weak. Fatigue: reports worsening fatigue.  During the interval, due to lower extremity weakness and new onset of back pain, she had MRI thoracic lumbar spine and CT chest abdomen pelvis with contrast. Unfortunately she has developed widely metastatic disease compatible with metastatic melanoma. 11/20/2018 MRI thoracic and lumbar spine showed numerous osseous lesions throughout thoracic and lumbar spine.  Epidural extension of tumor at T6 and L3 resulting in canal stenosis at T6 and moderate to severe canal stenosis at L3.  Severe T6 pathological compression deformity with associated metastatic lesion and vertebral epidural involvement.  Severe T12 compression deformity without definite associated underlying lesion. At least 3 bilateral pulmonary nodules. Chest abdomen pelvis showed numerous pulmonary and hepatic metastasis.  Right axillary lymphadenopathy with  associated posttreatment changes.  Patient has an appointment with [place] neurosurgery to discuss possible surgical options. Appetite is very poor.  She is lying in bed for most of the time.    Review of Systems  Constitutional: Positive for appetite change, fatigue and unexpected weight change. Negative for chills and fever.  HENT:   Negative for hearing loss and voice change.   Eyes: Negative for eye problems.  Respiratory: Negative for chest tightness and cough.   Cardiovascular: Negative for chest pain.  Gastrointestinal: Negative for abdominal distention, abdominal pain and blood in stool.  Endocrine: Negative for hot flashes.  Genitourinary: Negative for difficulty urinating and frequency.   Musculoskeletal: Positive for back pain. Negative for arthralgias.       Lower extremity pain  Skin: Negative for itching and rash.  Neurological: Negative for extremity weakness.  Hematological: Negative for adenopathy.  Psychiatric/Behavioral: Negative for confusion.    Past Medical History:  Diagnosis Date  . Anemia    b12 injections twice a month  . Breast cancer ([place]) 1992   LT LUMPECTOMY DCIS  . Cancer ([place]) 1992   DCIS left breast, treated with wide excision.  . Complication of anesthesia   . Diverticulosis 2006  . Enlarged lymph nodes in armpit 08/2018  . GERD (gastroesophageal reflux disease)   . Hypertension   . Hypothyroidism   . Melanoma ([place]) 08/2018  . Migraines   . Peripheral neuropathy    feet  . Pituitary mass ([place]) 08/2018   right  . PONV (postoperative nausea and vomiting)   . Thyroid disease    Past Surgical History:  Procedure Laterality Date  . AXILLARY LYMPH NODE BIOPSY Right 08/24/2018   Procedure: AXILLARY LYMPH NODE BIOPSY;  Surgeon: Robert Bellow,  MD;  Location: [facility] ORS;  Service: General;  Laterality: Right;  . AXILLARY LYMPH NODE BIOPSY Bilateral 09/06/2018   Procedure: AXILLARY LYMPH NODE BIOPSY LEFT;  Surgeon: Robert Bellow, MD;   Location: [facility] ORS;  Service: General;  Laterality: Bilateral;  . AXILLARY LYMPH NODE DISSECTION Bilateral 09/06/2018   Procedure: AXILLARY LYMPH NODE DISSECTION RIGHT;  Surgeon: Robert Bellow, MD;  Location: [facility] ORS;  Service: General;  Laterality: Bilateral;  . BREAST BIOPSY Left 03/21/2016   2 areas done by byrnett fibrocystic changes  . BREAST LUMPECTOMY Left 1993   DCIS, no chemo no radation  . BREAST SURGERY Left 1992   wide excision for DCIS  . CATARACT EXTRACTION W/PHACO Left 08/04/2017   Procedure: CATARACT EXTRACTION PHACO AND INTRAOCULAR LENS PLACEMENT (IOC);  Surgeon: Birder Robson, MD;  Location: [facility] ORS;  Service: Ophthalmology;  Laterality: Left;  [place] 00:40.7 AP% 14.6 CDE 5.97 Fluid Pack Lot # [id number]  . CATARACT EXTRACTION W/PHACO Right 08/18/2017   Procedure: CATARACT EXTRACTION PHACO AND INTRAOCULAR LENS PLACEMENT ([place]);  Surgeon: Birder Robson, MD;  Location: [facility] ORS;  Service: Ophthalmology;  Laterality: Right;  [place] 00:51.9 AP% 17.1 CDE 8.84 Fluid Pack Lot # [id number]  . COLONOSCOPY  2006   Dr Bary Castilla  . COLONOSCOPY WITH PROPOFOL N/A 04/18/2015   Procedure: COLONOSCOPY WITH PROPOFOL;  Surgeon: Robert Bellow, MD;  Location: [facility] ENDOSCOPY;  Service: Endoscopy;  Laterality: N/A;  . HERNIA REPAIR    . thumb surgery     injury with infection    Family History  Problem Relation Age of Onset  . Breast cancer Mother        32'S  . Diabetes Mother   . Lung cancer Father   . Cancer Sister 58       breast  . Breast cancer Sister 13  . Breast cancer Paternal Grandmother        70'S  . Diverticulitis Son        part of colon removed  . Neuropathy Son   . Diabetes Sister   . Hypertension Sister   . Neuropathy Son   . Diverticulitis Son     Social History   Socioeconomic History  . Marital status: Widowed    Spouse name: Not on file  . Number of children: 3  . Years of education: Not on file  . Highest education level: Associate degree:  occupational, Hotel manager, or vocational program  Occupational History  . Occupation: retired  Scientific laboratory technician  . Financial resource strain: Not hard at all  . Food insecurity    Worry: Never true    Inability: Never true  . Transportation needs    Medical: No    Non-medical: No  Tobacco Use  . Smoking status: Never Smoker  . Smokeless tobacco: Never Used  Substance and Sexual Activity  . Alcohol use: No  . Drug use: No  . Sexual activity: Not Currently  Lifestyle  . Physical activity    Days per week: 0 days    Minutes per session: 0 min  . Stress: To some extent  Relationships  . Social Herbalist on phone: Patient refused    Gets together: Patient refused    Attends religious service: Patient refused    Active member of club or organization: Patient refused    Attends meetings of clubs or organizations: Patient refused    Relationship status: Patient refused  . Intimate partner violence    Fear of current or  ex partner: Patient refused    Emotionally abused: Patient refused    Physically abused: Patient refused    Forced sexual activity: Patient refused  Other Topics Concern  . Not on file  Social History Narrative   Husband passed in 11/2017.    Current Outpatient Medications on File Prior to Visit  Medication Sig Dispense Refill  . acetaminophen (TYLENOL) 325 MG tablet Take 325-975 mg by mouth every 6 (six) hours as needed for moderate pain or headache.    . Artificial Tear Ointment (DRY EYES OP) Apply 1 drop to eye as needed (dry eyes).     . fentaNYL (DURAGESIC) 12 MCG/HR Place 1 patch onto the skin every 3 (three) days. 3 patch 0  . lansoprazole (PREVACID) 30 MG capsule Take 1 capsule (30 mg total) by mouth daily at 12 noon. 30 capsule 11  . levothyroxine (SYNTHROID, LEVOTHROID) 112 MCG tablet TAKE 1 TABLET BY MOUTH ONCE DAILY BEFORE BREAKFAST (Patient taking differently: Take 112 mcg by mouth daily before breakfast. ) 60 tablet 6  . NEEDLE, DISP, 25 G 25G  X 1" MISC Inject B12 twice a month 10 each 12  . oxyCODONE (OXY IR/ROXICODONE) 5 MG immediate release tablet Take 0.5-1 tablets (2.5-5 mg total) by mouth every 4 (four) hours as needed for breakthrough pain. 30 tablet 0  . sodium chloride (OCEAN) 0.65 % SOLN nasal spray Place 1 spray into both nostrils as needed for congestion.    . Syringe, Disposable, 3 ML MISC Use to inject B12 twice a month 25 each 12  . alendronate (FOSAMAX) 70 MG tablet Take 1 tablet (70 mg total) by mouth every 7 (seven) days. Take with a full glass of water on an empty stomach. (Patient not taking: Reported on 11/24/2018) 4 tablet 11  . aspirin 81 MG tablet Take 81 mg by mouth daily. In the evening    . carvedilol (COREG) 6.25 MG tablet TAKE 1 TABLET BY MOUTH TWICE DAILY (Patient not taking: Reported on 11/24/2018) 60 tablet 11  . cyanocobalamin (,VITAMIN B-12,) 1000 MCG/ML injection Inject 1000 mcg IM every 2 weeks (Patient not taking: Reported on 11/24/2018) 10 mL 3  . Multiple Vitamins-Minerals (PRESERVISION AREDS PO) Take by mouth.    . ondansetron (ZOFRAN) 8 MG tablet Take 1 tablet (8 mg total) by mouth 2 (two) times daily as needed (Nausea or vomiting). (Patient not taking: Reported on 11/24/2018) 30 tablet 1  . OVER THE COUNTER MEDICATION Take 1 capsule by mouth 2 (two) times daily. Alphoric Acid     No current facility-administered medications on file prior to visit.     Allergies  Allergen Reactions  . Penicillins Swelling and Other (See Comments)    Has patient had a PCN reaction causing immediate rash, facial/tongue/throat swelling, SOB or lightheadedness with hypotension: Yes Has patient had a PCN reaction causing severe rash involving mucus membranes or skin necrosis: No Has patient had a PCN reaction that required hospitalization: No Has patient had a PCN reaction occurring within the last 10 years: No If all of the above answers are "NO", then may proceed with Cephalosporin use.   . Sulfa Antibiotics  Swelling and Rash    swelling       Observations/Objective: Today's Vitals   11/24/18 1338  PainSc: 4    There is no height or weight on file to calculate BMI.  Physical Exam  Constitutional: She is oriented to person, place, and time. No distress.  Ill appearance frail  HENT:  Head:  Normocephalic.  Pulmonary/Chest: Effort normal.  Neurological: She is alert and oriented to person, place, and time.  Psychiatric: Affect normal.    CBC    Component Value Date/Time   WBC 9.1 11/15/2018 2130   RBC 5.09 11/15/2018 2130   HGB 13.6 11/15/2018 2130   HGB 12.6 08/20/2018 1119   HCT 41.7 11/15/2018 2130   HCT 38.6 08/20/2018 1119   PLT 252 11/15/2018 2130   PLT 283 08/20/2018 1119   MCV 81.9 11/15/2018 2130   MCV 84 08/20/2018 1119   MCH 26.7 11/15/2018 2130   MCHC 32.6 11/15/2018 2130   RDW 15.1 11/15/2018 2130   RDW 14.9 08/20/2018 1119   LYMPHSABS 1.5 08/20/2018 1119   EOSABS 0.2 08/20/2018 1119   BASOSABS 0.0 08/20/2018 1119    CMP     Component Value Date/Time   NA 133 (L) 11/15/2018 2130   NA 139 08/20/2018 1119   K 3.9 11/15/2018 2130   CL 100 11/15/2018 2130   CO2 24 11/15/2018 2130   GLUCOSE 113 (H) 11/15/2018 2130   BUN 15 11/15/2018 2130   BUN 16 08/20/2018 1119   CREATININE 0.63 11/15/2018 2130   CALCIUM 9.0 11/15/2018 2130   PROT 7.1 11/15/2018 2130   PROT 6.9 08/20/2018 1119   ALBUMIN 3.9 11/15/2018 2130   ALBUMIN 3.9 08/20/2018 1119   AST 19 11/15/2018 2130   ALT 12 11/15/2018 2130   ALKPHOS 84 11/15/2018 2130   BILITOT 0.7 11/15/2018 2130   BILITOT 0.5 08/20/2018 1119   GFRNONAA >60 11/15/2018 2130   GFRAA >60 11/15/2018 2130     Assessment and Plan: 1. Goals of care, counseling/discussion   2. Metastatic melanoma ([place])   3. Bone metastasis ([place])   4. Neoplasm related pain     Reviewed image results and discussed with patient. Unfortunately she has developed widely metastatic disease compatible with metastatic melanoma, involving lung,  liver, osseous metastasis.  Prognosis is poor.   Her condition has also rapidly declined, less likely to tolerate aggressive immunotherapy combination treatments.  At most she may be started with single agent immunotherapy with nivolumab which has a overall response of 20-30%.  Immunotherapy does not show effect immediately.  I am concerned that she will develop paralysis due to cord compression tumor growth. Appointment with [place] neurosurgery to discuss surgical options.  Patient informs me that she may not keep her appointment.  She is not interested in radiation therapy. She is interested in comfort care/hospice. She has established care with palliative care [facility] this morning. Neoplasm related pain, I started patient on fentanyl patch 12.5 MCG every 72 hours yesterday.  She tolerates well.  Patient takes oxycodone 5 mg every 4 hours as needed.  She feels that the pain is slightly better controlled today.   I communicated with patient's daughter 11/25/2018.  Today her pain is better controlled.  Refill sent to pharmacy. Can further titrate fentanyl patch dose according to her pain level. Start Dexamethasone 4mg  TID for bone pain.  Recommend patient to start bowel regimen with Colace 100 mg twice daily to avoid opiate-induced   I discussed the assessment and treatment plan with the patient. The patient was provided an opportunity to ask questions and all were answered. The patient agreed with the plan and demonstrated an understanding of the instructions.  The patient was advised to call back or seek an in-person evaluation if the symptoms worsen or if the condition fails to improve as anticipated.   I provided 25  minutes of face-to-face video visit time during this encounter, and > 50% was spent counseling as documented under my assessment & plan.  Earlie Server, MD 11/25/2018 6:43 PM

## 2018-11-25 NOTE — Telephone Encounter (Signed)
Contacted Josh and he spoke to [employer] at [place] care. She told him to disregard message.

## 2018-11-25 NOTE — Telephone Encounter (Signed)
Hospice called asking for orders for services. Please advise if you will sign and serve as attending

## 2018-11-26 ENCOUNTER — Encounter: Payer: PPO | Admitting: Hospice and Palliative Medicine

## 2018-11-29 ENCOUNTER — Ambulatory Visit: Payer: PPO | Admitting: Radiation Oncology

## 2018-11-29 ENCOUNTER — Encounter: Payer: Self-pay | Admitting: Oncology

## 2018-11-29 DIAGNOSIS — C7951 Secondary malignant neoplasm of bone: Secondary | ICD-10-CM | POA: Diagnosis not present

## 2018-11-30 ENCOUNTER — Telehealth: Payer: Self-pay | Admitting: *Deleted

## 2018-11-30 ENCOUNTER — Other Ambulatory Visit: Payer: Self-pay | Admitting: Oncology

## 2018-11-30 MED ORDER — FENTANYL 12 MCG/HR TD PT72: 1.0000 | MEDICATED_PATCH | TRANSDERMAL | 0 refills | Status: DC

## 2018-11-30 NOTE — Progress Notes (Signed)
Daughter communicated with me that patient needs refill fentanyl patch earlier as she messed up 1 patch. Suppose to refill on 6/24.  I discussed with pharmacist.  Discontinue previous prescription and I resent a new fentanyl patch Rx she can refill today.

## 2018-11-30 NOTE — Telephone Encounter (Signed)
FYI - Patient has rescheduled her Hospice evaluation until after [facility] evaluate which was to be yesterday, but was rescheduled due to technical difficulties

## 2018-12-03 ENCOUNTER — Other Ambulatory Visit: Payer: Self-pay | Admitting: General Surgery

## 2018-12-03 ENCOUNTER — Other Ambulatory Visit: Payer: Self-pay | Admitting: Family Medicine

## 2018-12-03 ENCOUNTER — Ambulatory Visit
Admission: RE | Admit: 2018-12-03 | Discharge: 2018-12-03 | Disposition: A | Discharge status: Home / Self Care | Payer: Self-pay | Source: Ambulatory Visit | Attending: Family Medicine | Admitting: Family Medicine

## 2018-12-03 DIAGNOSIS — R591 Generalized enlarged lymph nodes: Secondary | ICD-10-CM

## 2018-12-06 DIAGNOSIS — R29898 Other symptoms and signs involving the musculoskeletal system: Secondary | ICD-10-CM | POA: Diagnosis not present

## 2018-12-06 DIAGNOSIS — C7951 Secondary malignant neoplasm of bone: Secondary | ICD-10-CM | POA: Diagnosis not present

## 2018-12-16 ENCOUNTER — Telehealth: Payer: Self-pay | Admitting: *Deleted

## 2018-12-16 ENCOUNTER — Encounter: Payer: Self-pay | Admitting: Hospice and Palliative Medicine

## 2018-12-16 NOTE — Progress Notes (Signed)
I received a call from hospice that family is requesting services. The previous order is no longer valid and they would need a new order. Verbal order given for hospice services at home.

## 2018-12-16 NOTE — Telephone Encounter (Signed)
Daughter called hospice stating they are now ready for hospice services. Hospice needs a new order from Dr Tasia Catchings for this because they initially declined hospice, they can take a verbal order if you are still in agreement and want to serve as attending. Please let me know

## 2018-12-27 ENCOUNTER — Telehealth: Payer: Self-pay | Admitting: *Deleted

## 2018-12-27 ENCOUNTER — Other Ambulatory Visit: Payer: Self-pay | Admitting: Oncology

## 2018-12-27 MED ORDER — FENTANYL 25 MCG/HR TD PT72: 1.0000 | MEDICATED_PATCH | TRANSDERMAL | 0 refills | Status: AC

## 2019-01-08 NOTE — Telephone Encounter (Signed)
Katie Keith informed of increase in dose and prescription sent

## 2019-01-08 NOTE — Telephone Encounter (Signed)
Increased Fentanyl patch dose due to increased pain. Rx sent to pharmacy. Thank you.

## 2019-01-08 NOTE — Telephone Encounter (Signed)
Clarise Cruz from [facility] called reporting that patient needs refill of her Fentanyl, but would like it increased to 25 mcg instead of the 12 mcg patches due to increased pain. Please advise

## 2019-01-08 DEATH — deceased

## 2019-04-07 ENCOUNTER — Encounter: Payer: PPO | Admitting: Family Medicine

## 2019-04-07 ENCOUNTER — Ambulatory Visit: Payer: PPO

## 2020-03-09 IMAGING — MG DIGITAL DIAGNOSTIC BILATERAL MAMMOGRAM WITH TOMO AND CAD
6 of 12 series · 6 of 36 positions shown · non-contrast
Comparison: Previous exam(s).

CLINICAL DATA: Patient with recent diagnosis of metastatic melanoma
to the right axilla.

EXAM:
DIGITAL DIAGNOSTIC BILATERAL MAMMOGRAM WITH CAD AND TOMO

[R XCCL synth-2D]
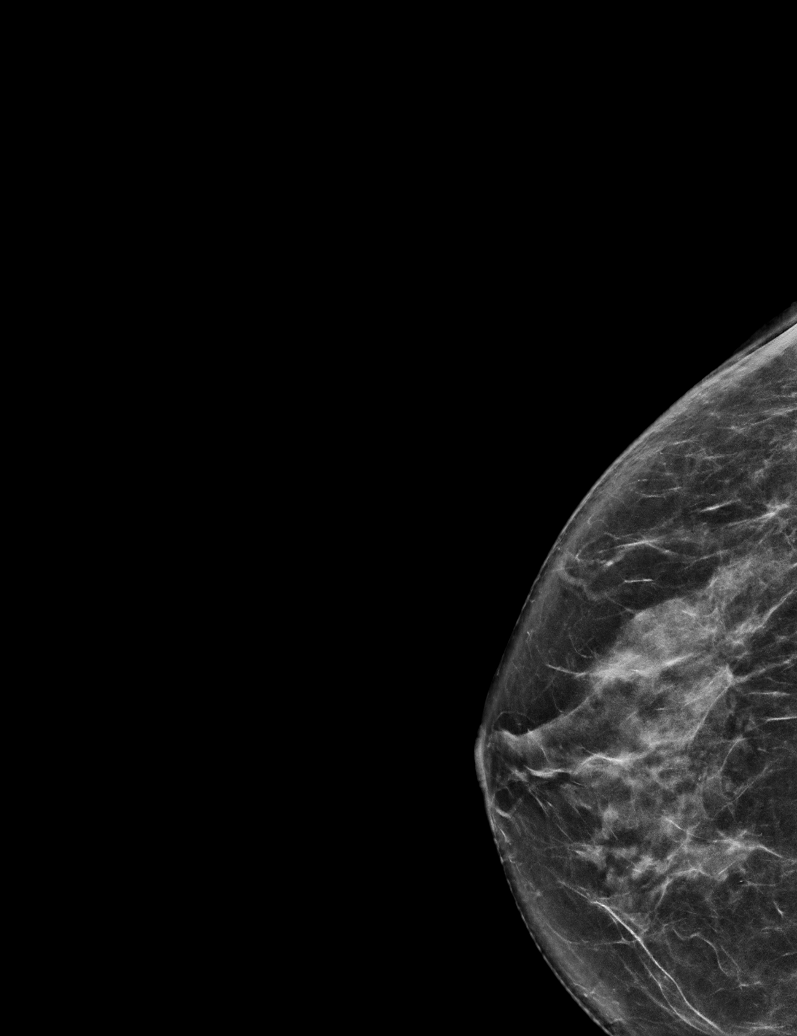

[R MLO synth-2D (1 of 2)]
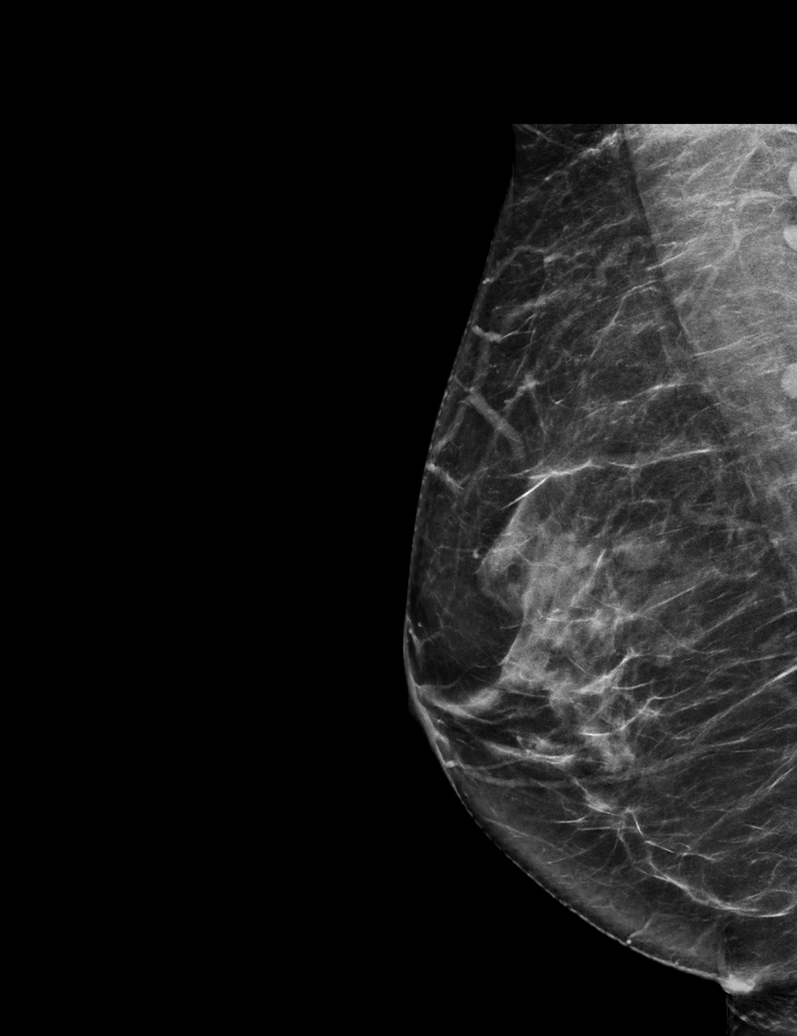

[L MLO synth-2D]
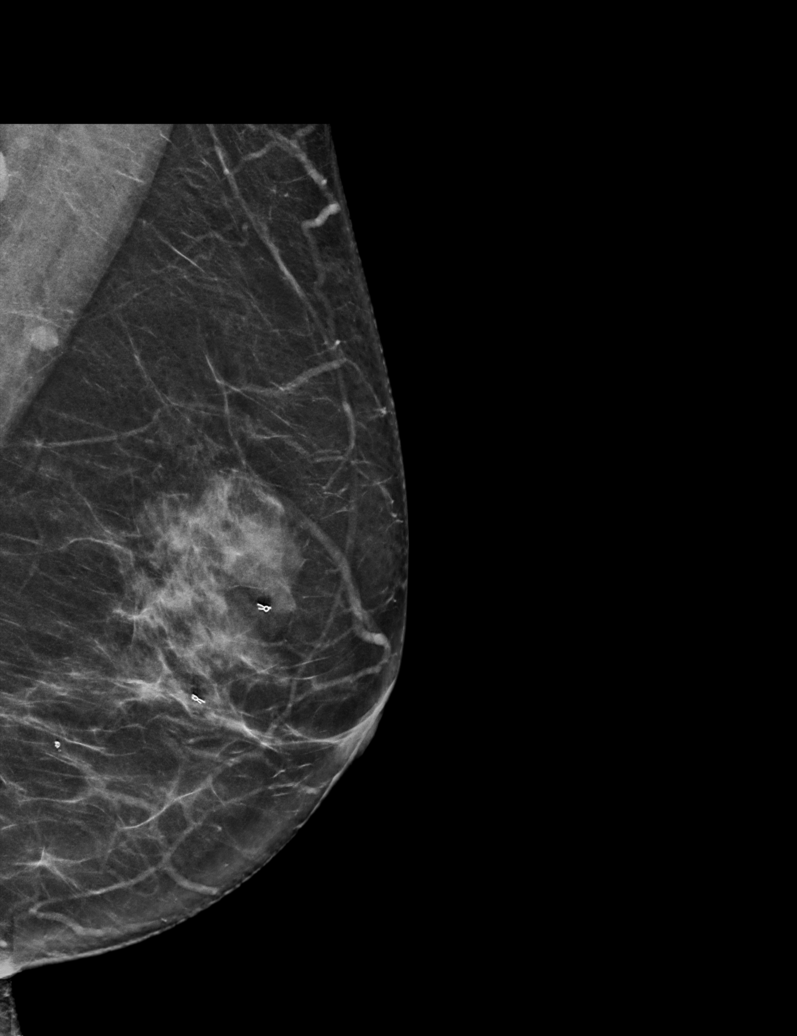

[L CC synth-2D]
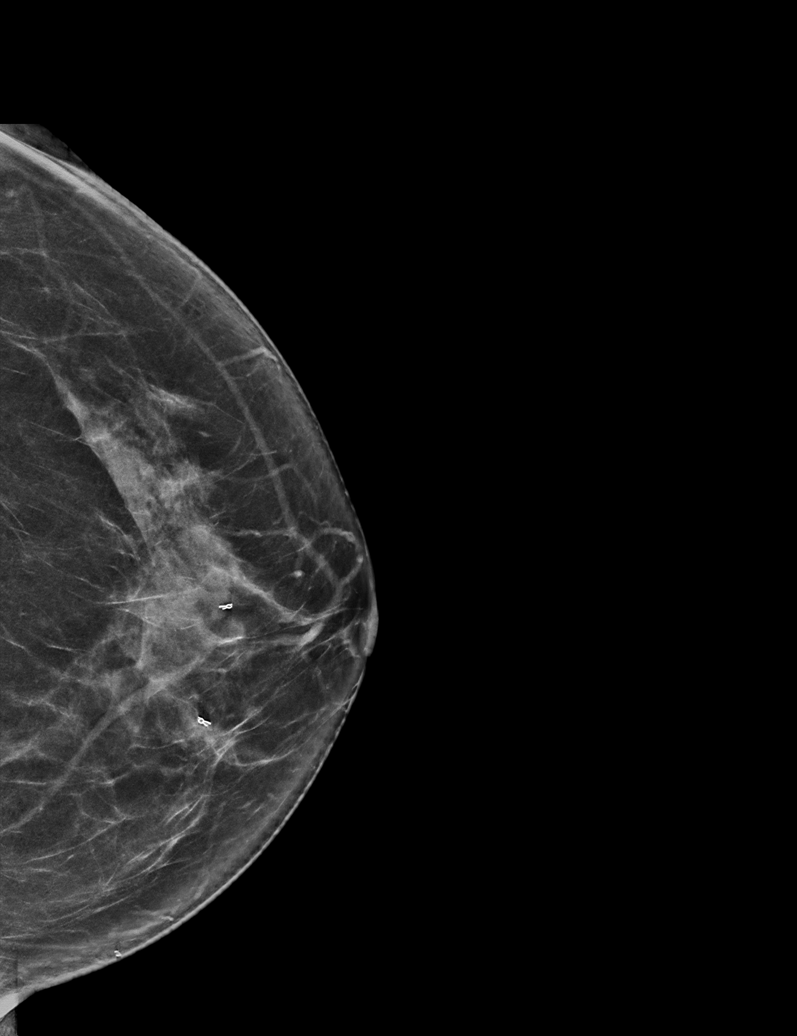

[R CC synth-2D]
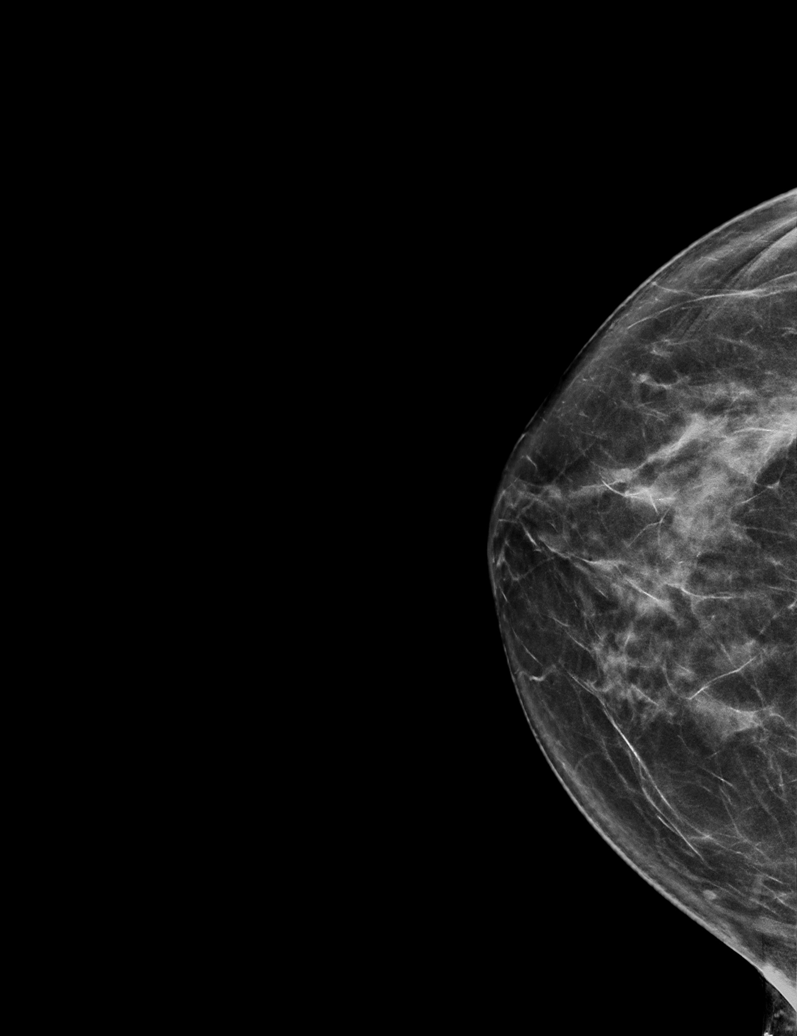

[R MLO synth-2D (2 of 2)]
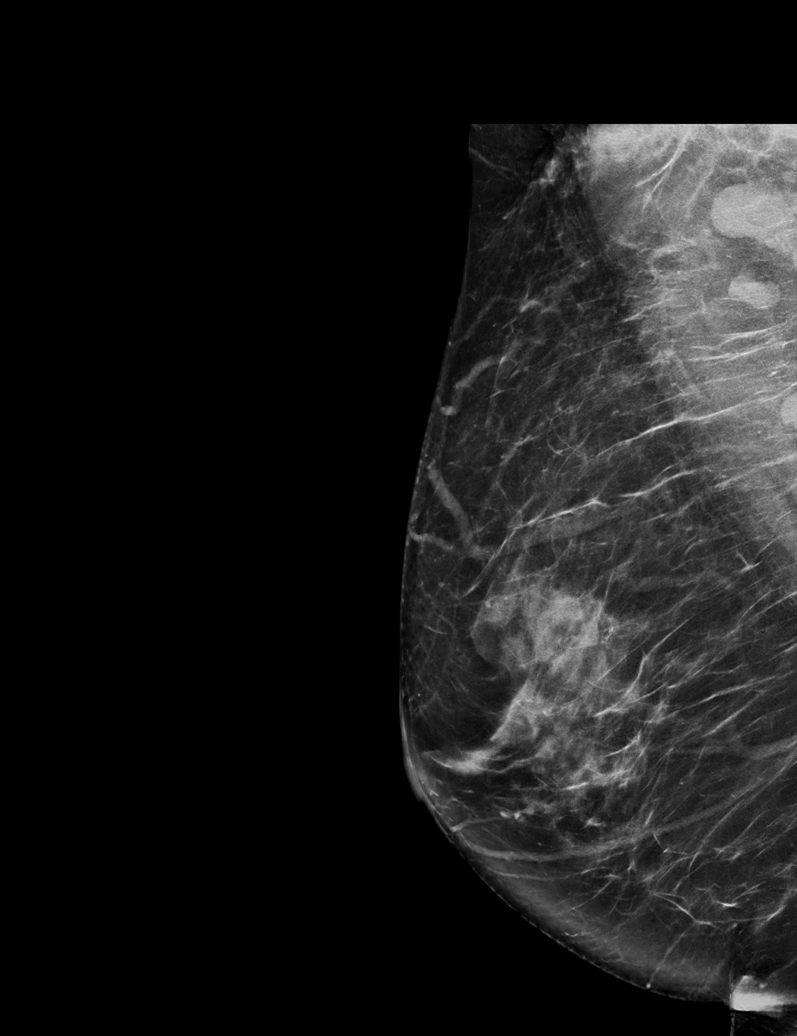

[6 of 36 positions shown; findings below may reference images not displayed]

ACR Breast Density Category c: The breast tissue is heterogeneously
dense, which may obscure small masses.
FINDINGS: No concerning masses, calcifications or distortion identified within
either breast. Within the right axilla there is a lobular mass with
surrounding suspected post biopsy changes.

Mammographic images were processed with CAD.
IMPRESSION: No mammographic evidence for breast malignancy.

Lobular mass within the right axilla compatible with biopsy-proven
malignant melanoma.

RECOMMENDATION:
Screening mammogram in one year.(Code:[id number]).

I have discussed the findings and recommendations with the patient.
Results were also provided in writing at the conclusion of the
visit. If applicable, a reminder letter will be sent to the patient
regarding the next appointment.

BI-RADS CATEGORY  2: Benign.
